# Patient Record
Sex: Female | Born: 1980 | Race: Black or African American | Hispanic: No | Marital: Single | Smoking: Never smoker
Health system: Southern US, Community
[De-identification: ages and names within clinical notes are randomized; demographics above are authoritative.]

## PROBLEM LIST (undated history)

## (undated) DIAGNOSIS — S82843A Displaced bimalleolar fracture of unspecified lower leg, initial encounter for closed fracture: Secondary | ICD-10-CM

## (undated) HISTORY — PX: NO PAST SURGERIES: SHX2092

---
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
## 2004-02-18 ENCOUNTER — Emergency Department (HOSPITAL_COMMUNITY): Admission: EM | Admit: 2004-02-18 | Discharge: 2004-02-18 | Payer: Self-pay | Admitting: Emergency Medicine

## 2004-04-26 ENCOUNTER — Emergency Department (HOSPITAL_COMMUNITY): Admission: EM | Admit: 2004-04-26 | Discharge: 2004-04-27 | Payer: Self-pay | Admitting: Emergency Medicine

## 2004-11-27 ENCOUNTER — Emergency Department (HOSPITAL_COMMUNITY): Admission: EM | Admit: 2004-11-27 | Discharge: 2004-11-27 | Payer: Self-pay | Admitting: Emergency Medicine

## 2009-06-02 ENCOUNTER — Emergency Department (HOSPITAL_COMMUNITY): Admission: EM | Admit: 2009-06-02 | Discharge: 2009-06-02 | Payer: Self-pay | Admitting: Emergency Medicine

## 2010-01-01 ENCOUNTER — Emergency Department (HOSPITAL_COMMUNITY): Admission: EM | Admit: 2010-01-01 | Discharge: 2010-01-01 | Payer: Self-pay | Admitting: Family Medicine

## 2010-01-03 ENCOUNTER — Emergency Department (HOSPITAL_COMMUNITY): Admission: EM | Admit: 2010-01-03 | Discharge: 2010-01-03 | Payer: Self-pay | Admitting: Family Medicine

## 2010-12-09 ENCOUNTER — Inpatient Hospital Stay (INDEPENDENT_AMBULATORY_CARE_PROVIDER_SITE_OTHER)
Admission: RE | Admit: 2010-12-09 | Discharge: 2010-12-09 | Disposition: A | Discharge status: Home / Self Care | Payer: Self-pay | Source: Ambulatory Visit | Attending: Family Medicine | Admitting: Family Medicine

## 2010-12-09 DIAGNOSIS — R071 Chest pain on breathing: Secondary | ICD-10-CM

## 2010-12-09 DIAGNOSIS — IMO0002 Reserved for concepts with insufficient information to code with codable children: Secondary | ICD-10-CM

## 2010-12-12 ENCOUNTER — Inpatient Hospital Stay (INDEPENDENT_AMBULATORY_CARE_PROVIDER_SITE_OTHER)
Admission: RE | Admit: 2010-12-12 | Discharge: 2010-12-12 | Disposition: A | Discharge status: Home / Self Care | Payer: Self-pay | Source: Ambulatory Visit | Attending: Family Medicine | Admitting: Family Medicine

## 2010-12-12 DIAGNOSIS — IMO0002 Reserved for concepts with insufficient information to code with codable children: Secondary | ICD-10-CM
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
## 2011-04-06 ENCOUNTER — Inpatient Hospital Stay (INDEPENDENT_AMBULATORY_CARE_PROVIDER_SITE_OTHER)
Admission: RE | Admit: 2011-04-06 | Discharge: 2011-04-06 | Disposition: A | Discharge status: Home / Self Care | Payer: Self-pay | Source: Ambulatory Visit | Attending: Family Medicine | Admitting: Family Medicine

## 2011-04-06 DIAGNOSIS — IMO0002 Reserved for concepts with insufficient information to code with codable children: Secondary | ICD-10-CM

## 2011-10-02 ENCOUNTER — Encounter (HOSPITAL_COMMUNITY): Payer: Self-pay | Admitting: *Deleted

## 2011-10-02 ENCOUNTER — Emergency Department (HOSPITAL_COMMUNITY)
Admission: EM | Admit: 2011-10-02 | Discharge: 2011-10-03 | Disposition: A | Discharge status: Home / Self Care | Payer: Self-pay | Attending: Emergency Medicine | Admitting: Emergency Medicine

## 2011-10-02 DIAGNOSIS — R609 Edema, unspecified: Secondary | ICD-10-CM | POA: Insufficient documentation

## 2011-10-02 DIAGNOSIS — L02412 Cutaneous abscess of left axilla: Secondary | ICD-10-CM

## 2011-10-02 DIAGNOSIS — M7989 Other specified soft tissue disorders: Secondary | ICD-10-CM | POA: Insufficient documentation

## 2011-10-02 DIAGNOSIS — IMO0002 Reserved for concepts with insufficient information to code with codable children: Secondary | ICD-10-CM | POA: Insufficient documentation

## 2011-10-02 DIAGNOSIS — M79609 Pain in unspecified limb: Secondary | ICD-10-CM | POA: Insufficient documentation

## 2011-10-02 MED ORDER — HYDROCODONE-ACETAMINOPHEN 5-325 MG PO TABS: 1.0000 | ORAL_TABLET | ORAL | Status: AC | PRN

## 2011-10-02 MED ORDER — SULFAMETHOXAZOLE-TRIMETHOPRIM 800-160 MG PO TABS: 1.0000 | ORAL_TABLET | Freq: Two times a day (BID) | ORAL | Status: AC

## 2011-10-02 MED ORDER — HYDROCODONE-ACETAMINOPHEN 5-325 MG PO TABS
1.0000 | ORAL_TABLET | Freq: Once | ORAL | Status: AC
  Administered 2011-10-02: 1 via ORAL
  Filled 2011-10-02: qty 1

## 2011-10-02 NOTE — ED Notes (Signed)
Pt reports having a boil under (L) arm since Thursday of last week.  Hx of boil under (R) arm.  Pt has not messed with boil.  denies fever.

## 2011-10-02 NOTE — ED Notes (Signed)
Boil under her lt arm since Friday.  She has been treated for the saem previously and antibiotics took it away

## 2011-10-02 NOTE — Discharge Instructions (Signed)

## 2011-10-03 NOTE — ED Provider Notes (Signed)
History     CSN: [id number]  Arrival date & time 10/02/11  2026   First MD Initiated Contact with Patient 10/02/11 2144      Chief Complaint  Patient presents with  . Recurrent Skin Infections    (Consider location/radiation/quality/duration/timing/severity/associated sxs/prior treatment) HPI Comments: Patient here with left axillary abscess - states history of same - reports no fever or chills - no drainage from the area.  Patient is a 31 y.o. female presenting with abscess. The history is provided by the patient. No language interpreter was used.  Abscess  This is a new problem. The current episode started less than one week ago. The onset was gradual. The problem occurs frequently. The problem has been unchanged. The abscess is present on the left arm. The problem is severe. The abscess is characterized by redness, painfulness and swelling. The patient was exposed to OTC medications. The abscess first occurred at home. Pertinent negatives include no anorexia, no decrease in physical activity, not sleeping less, not drinking less, no fever, no fussiness, not sleeping more, no diarrhea, no vomiting, no congestion, no rhinorrhea, no sore throat, no decreased responsiveness and no cough. Her past medical history does not include skin abscesses in family. There were no sick contacts. She has received no recent medical care.    History reviewed. No pertinent past medical history.  History reviewed. No pertinent past surgical history.  History reviewed. No pertinent family history.  History  Substance Use Topics  . Smoking status: Never Smoker   . Smokeless tobacco: Not on file  . Alcohol Use: Yes    OB History    Grav Para Term Preterm Abortions TAB SAB Ect Mult Living                  Review of Systems  Constitutional: Negative for fever and decreased responsiveness.  HENT: Negative for congestion, sore throat and rhinorrhea.   Respiratory: Negative for cough.     Gastrointestinal: Negative for vomiting, diarrhea and anorexia.  All other systems reviewed and are negative.    Allergies  Review of patient's allergies indicates no known allergies.  Home Medications   Current Outpatient Rx  Name Route Sig Dispense Refill  . HYDROCODONE-ACETAMINOPHEN 5-325 MG PO TABS Oral Take 1 tablet by mouth every 6 (six) hours as needed. For pain    . NYQUIL PO Oral Take 30 mLs by mouth daily as needed. For cough/cold symptoms    . HYDROCODONE-ACETAMINOPHEN 5-325 MG PO TABS Oral Take 1 tablet by mouth every 4 (four) hours as needed for pain. 30 tablet 0  . SULFAMETHOXAZOLE-TRIMETHOPRIM 800-160 MG PO TABS Oral Take 1 tablet by mouth every 12 (twelve) hours. 10 tablet 0    BP 118/83  Pulse 84  Temp(Src) 98.3 F (36.8 C) (Oral)  Resp 18  SpO2 100%  LMP 09/01/2011  Physical Exam  Nursing note and vitals reviewed. Constitutional: She is oriented to person, place, and time. She appears well-developed and well-nourished. No distress.  HENT:  Head: Normocephalic and atraumatic.  Right Ear: External ear normal.  Left Ear: External ear normal.  Nose: Nose normal.  Mouth/Throat: Oropharynx is clear and moist. No oropharyngeal exudate.  Eyes: Conjunctivae are normal. Pupils are equal, round, and reactive to light. No scleral icterus.  Neck: Normal range of motion. Neck supple.  Cardiovascular: Normal rate, regular rhythm and normal heart sounds.  Exam reveals no gallop and no friction rub.   No murmur heard. Pulmonary/Chest: Breath sounds normal. No respiratory  distress. She exhibits no tenderness.  Abdominal: Soft. Bowel sounds are normal. She exhibits no distension. There is no tenderness.  Musculoskeletal: Normal range of motion. She exhibits edema and tenderness.  Lymphadenopathy:    She has no cervical adenopathy.  Neurological: She is alert and oriented to person, place, and time. No cranial nerve deficit.  Skin: Skin is warm and dry. No rash noted.  There is erythema. No pallor.       Large fluctuance under left axilla  Psychiatric: She has a normal mood and affect. Her behavior is normal. Judgment and thought content normal.    ED Course  Procedures (including critical care time)  Labs Reviewed - No data to display No results found. INCISION AND DRAINAGE Performed by: Cherrie Distance C. Consent: Verbal consent obtained. Risks and benefits: risks, benefits and alternatives were discussed Type: abscess  Body area: left axilla  Anesthesia: local infiltration  Local anesthetic: lidocaine 2% without epinephrine  Anesthetic total: 5 ml  Complexity: complex Blunt dissection to break up loculations  Drainage: purulent  Drainage amount: large  Packing material: 1/4 in iodoform gauze  Patient tolerance: Patient tolerated the procedure well with no immediate complications.    1. Abscess of axilla, left       MDM  Patient with large left axillary abscess - placed on abx and pain control - patient to follow up in 2 days for packing removal and re-evaluation.        Izola Price [place], [place] 10/03/11 [phone]

## 2011-10-03 NOTE — ED Provider Notes (Signed)
Medical screening examination/treatment/procedure(s) were performed by non-physician practitioner and as supervising physician I was immediately available for consultation/collaboration.  Kya Mayfield, MD 10/03/11 0724 

## 2011-10-04 ENCOUNTER — Emergency Department (INDEPENDENT_AMBULATORY_CARE_PROVIDER_SITE_OTHER)
Admission: EM | Admit: 2011-10-04 | Discharge: 2011-10-04 | Disposition: A | Discharge status: Home / Self Care | Payer: Self-pay | Source: Home / Self Care | Attending: Emergency Medicine | Admitting: Emergency Medicine

## 2011-10-04 ENCOUNTER — Encounter (HOSPITAL_COMMUNITY): Payer: Self-pay | Admitting: Emergency Medicine

## 2011-10-04 DIAGNOSIS — IMO0002 Reserved for concepts with insufficient information to code with codable children: Secondary | ICD-10-CM

## 2011-10-04 DIAGNOSIS — L02412 Cutaneous abscess of left axilla: Secondary | ICD-10-CM

## 2011-10-04 NOTE — Discharge Instructions (Signed)
You may wash the area with mild soap and water now that the packing is removed. Change the dressing once a day or more often if needed, until the incision is healed. Continue your antibiotic as prescribed. If you still have redness and swelling at the end of your antibiotic return for recheck, sooner if worsening.

## 2011-10-04 NOTE — ED Provider Notes (Signed)
Medical screening examination/treatment/procedure(s) were performed by non-physician practitioner and as supervising physician I was immediately available for consultation/collaboration.  Raynald Blend, MD 10/04/11 [phone]

## 2011-10-04 NOTE — ED Notes (Signed)
Pt here for packing removal of left axilla area. Pt states her first day on antibiotics she had some nausea and vomiting, but it has improved. Pt had some serosanguinous described drainage last night but it stopped after about 20 minutes. No drainage today.

## 2011-10-04 NOTE — ED Provider Notes (Signed)
History     CSN: [id number]  Arrival date & time 10/04/11  1652   First MD Initiated Contact with Patient 10/04/11 1703      Chief Complaint  Patient presents with  . Wound Check    (Consider location/radiation/quality/duration/timing/severity/associated sxs/prior treatment) HPI Comments: Pt presents today for recheck of abscess Lt axilla. She was seen in ED 2 days ago and had I&D. She is still noting some drainage, but overall is improving. No fever or chills.    History reviewed. No pertinent past medical history.  History reviewed. No pertinent past surgical history.  No family history on file.  History  Substance Use Topics  . Smoking status: Never Smoker   . Smokeless tobacco: Not on file  . Alcohol Use: Yes    OB History    Grav Para Term Preterm Abortions TAB SAB Ect Mult Living                  Review of Systems  Constitutional: Negative for fever and chills.  Gastrointestinal: Negative for vomiting, abdominal pain and diarrhea.    Allergies  Review of patient's allergies indicates no known allergies.  Home Medications   Current Outpatient Rx  Name Route Sig Dispense Refill  . NYQUIL PO Oral Take 30 mLs by mouth daily as needed. For cough/cold symptoms    . SULFAMETHOXAZOLE-TRIMETHOPRIM 800-160 MG PO TABS Oral Take 1 tablet by mouth every 12 (twelve) hours. 10 tablet 0  . HYDROCODONE-ACETAMINOPHEN 5-325 MG PO TABS Oral Take 1 tablet by mouth every 4 (four) hours as needed for pain. 30 tablet 0    BP 114/80  Pulse 78  Temp(Src) 99.2 F (37.3 C) (Oral)  Resp 17  LMP 09/01/2011  Physical Exam  Nursing note and vitals reviewed. Constitutional: She appears well-developed and well-nourished. No distress.  HENT:  Head: Normocephalic and atraumatic.  Skin: Skin is warm and dry.       Lt axilla - abscess noted posterior inferior axilla. Packing removed. Moderate amount of yellowish pus expressed.   Psychiatric: She has a normal mood and affect.     ED Course  Procedures (including critical care time)  Labs Reviewed - No data to display No results found.   1. Abscess of axilla, left       MDM   Abscess Lt axilla - recheck and packing removal. Pt noting overall improvement.        Melody Com[place], [place] 10/04/11 1750

## 2012-04-25 ENCOUNTER — Emergency Department (HOSPITAL_COMMUNITY)
Admission: EM | Admit: 2012-04-25 | Discharge: 2012-04-25 | Disposition: A | Discharge status: Home / Self Care | Payer: Self-pay | Attending: Emergency Medicine | Admitting: Emergency Medicine

## 2012-04-25 ENCOUNTER — Emergency Department (HOSPITAL_COMMUNITY): Payer: Self-pay

## 2012-04-25 ENCOUNTER — Encounter (HOSPITAL_COMMUNITY): Payer: Self-pay | Admitting: Family Medicine

## 2012-04-25 DIAGNOSIS — B372 Candidiasis of skin and nail: Secondary | ICD-10-CM | POA: Insufficient documentation

## 2012-04-25 DIAGNOSIS — R059 Cough, unspecified: Secondary | ICD-10-CM | POA: Insufficient documentation

## 2012-04-25 DIAGNOSIS — R05 Cough: Secondary | ICD-10-CM

## 2012-04-25 MED ORDER — HYDROCODONE-HOMATROPINE 5-1.5 MG/5ML PO SYRP: 5.0000 mL | ORAL_SOLUTION | Freq: Four times a day (QID) | ORAL | Status: AC | PRN

## 2012-04-25 MED ORDER — CLOTRIMAZOLE 1 % EX CREA: TOPICAL_CREAM | CUTANEOUS | Status: AC

## 2012-04-25 NOTE — ED Notes (Signed)
Complaining of dry cough x 1 week. sts pain in chest with cough and movement. Denies fever or injury

## 2012-04-25 NOTE — ED Notes (Signed)
Lungs  Clear through out.  St's she has had dry cough for several days. Denies sore throat nausea or vomiting

## 2012-04-25 NOTE — ED Provider Notes (Signed)
History   This chart was scribed for Gerhard Munch, MD by Melba Coon. The patient was seen in room TR07C/TR07C and the patient's care was started at 7:32PM.    CSN: [id number]  Arrival date & time 04/25/12  1551   First MD Initiated Contact with Patient 04/25/12 1823      Chief Complaint  Patient presents with  . Cough    (Consider location/radiation/quality/duration/timing/severity/associated sxs/prior treatment) The history is provided by the patient. No language interpreter was used.   Gina Jones is a 31 y.o. female who presents to the Emergency Department complaining of persistent, moderate non-productive cough with an onset a week ago. Pt states that she had mold in her house a month ago. Rash under her breast and in her groin area present. Pt also c/o CP that is aggravated with change in position for the past 2 weeks. No HA, fever, neck pain, sore throat, back pain, SOB, abd pain, n/v/d, dysuria, vaginal d/c or bleeding, or extremity pain, edema, weakness, numbness, or tingling. No known allergies. No other pertinent medical symptoms. Pt is a former current smoker.  History reviewed. No pertinent past medical history.  History reviewed. No pertinent past surgical history.  History reviewed. No pertinent family history.    OB History    Grav Para Term Preterm Abortions TAB SAB Ect Mult Living                  Review of Systems  Respiratory: Positive for cough.    10 Systems reviewed and all are negative for acute change except as noted in the HPI.   Allergies  Review of patient's allergies indicates no known allergies.  Home Medications  No current outpatient prescriptions on file.  BP 158/84  Pulse 89  Temp 98.4 F (36.9 C) (Oral)  Resp 16  SpO2 98%  LMP 04/03/2012  Physical Exam  Nursing note and vitals reviewed. Constitutional: She is oriented to person, place, and time. She appears well-developed and well-nourished.  HENT:  Head:  Normocephalic and atraumatic.  Eyes: EOM are normal. Pupils are equal, round, and reactive to light.  Neck: Normal range of motion. Neck supple.  Cardiovascular: Normal rate, normal heart sounds and intact distal pulses.   Pulmonary/Chest: Effort normal and breath sounds normal.  Abdominal: Bowel sounds are normal. She exhibits no distension. There is no tenderness.  Musculoskeletal: Normal range of motion. She exhibits no edema and no tenderness.  Neurological: She is alert and oriented to person, place, and time. She has normal strength. No cranial nerve deficit or sensory deficit.  Skin: Skin is warm and dry. Rash (Inframammary rash that is minimally raised and bilaterally symmetrical) noted.  Psychiatric: She has a normal mood and affect.    ED Course  Procedures (including critical care time)  COORDINATION OF CARE:  7:37PM - imaging reviewed; results are unremarkable. pt will be Rx hycodan and topical anti-fungal cream. Pt should f/u with PCP or ED if symptoms get worse. Pt ready for d/c.   Labs Reviewed - No data to display Dg Chest 2 View  04/25/2012  *RADIOLOGY REPORT*  Clinical Data: Cough, chest pain  CHEST - 2 VIEW  Comparison: 06/02/2009  Findings: Lungs are essentially clear. No pleural effusion or pneumothorax.  Cardiomediastinal silhouette is within normal limits.  Visualized osseous structures are within normal limits.  IMPRESSION: No evidence of acute cardiopulmonary disease.   Original Report Authenticated By: Charline Bills, M.D.      No diagnosis found.  MDM  I personally performed the services described in this documentation, which was scribed in my presence. The recorded information has been reviewed and considered.  This generally healthy Gina Jones female presents with 2 complaints.  The patient's skin lesions are most consistent with cutaneous candidiasis, with low suspicion of concurrent bacterial infection.  The patient's respiratory complaints were  evaluated with a chest x-ray which did not demonstrate pneumonia.  Distress, hypoxia, tachypnea, tachycardia and suspicion for occult pneumonia or PE.  The patient was discharged in stable condition with resources for primary care followup.  Gerhard Munch, MD 04/25/12 [phone]

## 2014-04-11 ENCOUNTER — Encounter (HOSPITAL_COMMUNITY): Payer: Self-pay | Admitting: Emergency Medicine

## 2014-04-11 ENCOUNTER — Emergency Department (HOSPITAL_COMMUNITY)
Admission: EM | Admit: 2014-04-11 | Discharge: 2014-04-11 | Disposition: A | Discharge status: Home / Self Care | Payer: BC Managed Care – PPO | Attending: Emergency Medicine | Admitting: Emergency Medicine

## 2014-04-11 DIAGNOSIS — H9209 Otalgia, unspecified ear: Secondary | ICD-10-CM | POA: Insufficient documentation

## 2014-04-11 DIAGNOSIS — H6690 Otitis media, unspecified, unspecified ear: Secondary | ICD-10-CM | POA: Insufficient documentation

## 2014-04-11 DIAGNOSIS — H6692 Otitis media, unspecified, left ear: Secondary | ICD-10-CM

## 2014-04-11 MED ORDER — AMOXICILLIN-POT CLAVULANATE 875-125 MG PO TABS: 1.0000 | ORAL_TABLET | Freq: Two times a day (BID) | ORAL | Status: DC

## 2014-04-11 MED ORDER — HYDROCODONE-ACETAMINOPHEN 5-325 MG PO TABS
1.0000 | ORAL_TABLET | Freq: Once | ORAL | Status: AC
  Administered 2014-04-11: 1 via ORAL
  Filled 2014-04-11: qty 1

## 2014-04-11 MED ORDER — HYDROCODONE-ACETAMINOPHEN 5-325 MG PO TABS: 1.0000 | ORAL_TABLET | Freq: Four times a day (QID) | ORAL | Status: DC | PRN

## 2014-04-11 MED ORDER — GUAIFENESIN ER 1200 MG PO TB12: 1.0000 | ORAL_TABLET | Freq: Two times a day (BID) | ORAL | Status: DC

## 2014-04-11 NOTE — ED Notes (Signed)
She states "my left ears been hurting for 2 days."

## 2014-04-11 NOTE — ED Provider Notes (Signed)
Medical screening examination/treatment/procedure(s) were performed by non-physician practitioner and as supervising physician I was immediately available for consultation/collaboration.   EKG Interpretation None        Layla Maw Annalei Friesz, DO 04/11/14 1426

## 2014-04-11 NOTE — ED Notes (Signed)
Declined W/C at D/C and was escorted to lobby by RN. 

## 2014-04-11 NOTE — Discharge Instructions (Signed)
Return here as needed.  Followup with an urgent care primary care Dr. increase your fluid intake

## 2014-04-11 NOTE — ED Provider Notes (Signed)
CSN: [id number]     Arrival date & time 04/11/14  1233 History  This chart was scribed for non-physician practitioner Ebbie Ridge, PA-C working with Layla Maw Ward, DO by Leone Payor, ED Scribe. This patient was seen in room TR04C/TR04C and the patient's care was started at 1:37 PM.    Chief Complaint  Patient presents with  . Otalgia    The history is provided by the patient. No language interpreter was used.    HPI Comments: HIDAYA DANIEL is a 33 y.o. female who presents to the Emergency Department complaining of 2 days of gradual onset, constant, gradually worsening left sided otalgia. She has used hydrogen peroxide and taken Advil and BC powders without relief. She denies cough, rhinorrhea, decreased hearing, sore throat, nasal congestion.   History reviewed. No pertinent past medical history. History reviewed. No pertinent past surgical history. History reviewed. No pertinent family history. History  Substance Use Topics  . Smoking status: Never Smoker   . Smokeless tobacco: Not on file  . Alcohol Use: Yes   OB History   Grav Para Term Preterm Abortions TAB SAB Ect Mult Living                 Review of Systems  HENT: Positive for ear pain. Negative for congestion, hearing loss, rhinorrhea and sore throat.   Respiratory: Negative for cough.       Allergies  Review of patient's allergies indicates no known allergies.  Home Medications   Prior to Admission medications   Not on File   BP 137/92  Pulse 63  Temp(Src) 98.2 F (36.8 C) (Oral)  Resp 18  Ht  (1.727 m)  Wt 220 lb (99.791 kg)  BMI 33.46 kg/m2  SpO2 100% Physical Exam  Nursing note and vitals reviewed. Constitutional: She is oriented to person, place, and time. She appears well-developed and well-nourished.  HENT:  Head: Normocephalic and atraumatic.  Left Ear: Tympanic membrane is erythematous and bulging.  Redness and bulging of left TM.   Cardiovascular: Normal rate.   Pulmonary/Chest:  Effort normal.  Abdominal: She exhibits no distension.  Neurological: She is alert and oriented to person, place, and time.  Skin: Skin is warm and dry.  Psychiatric: She has a normal mood and affect.    ED Course  Procedures (including critical care time)  DIAGNOSTIC STUDIES: Oxygen Saturation is 100% on RA, normal by my interpretation.    COORDINATION OF CARE: 1:39 PM Discussed treatment plan with pt at bedside and pt agreed to plan.     Carlyle Dolly, PA-C 04/11/14 1357

## 2014-04-24 ENCOUNTER — Emergency Department (HOSPITAL_COMMUNITY)
Admission: EM | Admit: 2014-04-24 | Discharge: 2014-04-24 | Disposition: A | Discharge status: Home / Self Care | Payer: BC Managed Care – PPO | Attending: Emergency Medicine | Admitting: Emergency Medicine

## 2014-04-24 ENCOUNTER — Encounter (HOSPITAL_COMMUNITY): Payer: Self-pay | Admitting: Emergency Medicine

## 2014-04-24 DIAGNOSIS — S3981XA Other specified injuries of abdomen, initial encounter: Secondary | ICD-10-CM | POA: Diagnosis present

## 2014-04-24 DIAGNOSIS — Y92410 Unspecified street and highway as the place of occurrence of the external cause: Secondary | ICD-10-CM | POA: Diagnosis not present

## 2014-04-24 DIAGNOSIS — Z79899 Other long term (current) drug therapy: Secondary | ICD-10-CM | POA: Diagnosis not present

## 2014-04-24 DIAGNOSIS — Z3202 Encounter for pregnancy test, result negative: Secondary | ICD-10-CM | POA: Diagnosis not present

## 2014-04-24 DIAGNOSIS — S301XXA Contusion of abdominal wall, initial encounter: Secondary | ICD-10-CM | POA: Insufficient documentation

## 2014-04-24 DIAGNOSIS — Y9389 Activity, other specified: Secondary | ICD-10-CM | POA: Insufficient documentation

## 2014-04-24 LAB — URINALYSIS, ROUTINE W REFLEX MICROSCOPIC
BILIRUBIN URINE: NEGATIVE
Glucose, UA: NEGATIVE mg/dL
HGB URINE DIPSTICK: NEGATIVE
Ketones, ur: NEGATIVE mg/dL
Leukocytes, UA: NEGATIVE
Nitrite: NEGATIVE
PH: 6 (ref 5.0–8.0)
Protein, ur: NEGATIVE mg/dL
SPECIFIC GRAVITY, URINE: 1.008 (ref 1.005–1.030)
UROBILINOGEN UA: 0.2 mg/dL (ref 0.0–1.0)

## 2014-04-24 LAB — POC URINE PREG, ED: PREG TEST UR: NEGATIVE
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
## 2014-04-24 NOTE — Discharge Instructions (Signed)
SEEK IMMEDIATE MEDICAL ATTENTION IF: New numbness, tingling, weakness, or problem with the use of your arms or legs.  Severe pain not relieved with OTC medications.  Change in bowel or bladder control.  Increasing pain in any areas of the body (such as chest or abdominal pain).  Shortness of breath, dizziness or fainting.  Nausea (feeling sick to your stomach), vomiting, fever, or sweats.

## 2014-06-18 ENCOUNTER — Emergency Department (INDEPENDENT_AMBULATORY_CARE_PROVIDER_SITE_OTHER)
Admission: EM | Admit: 2014-06-18 | Discharge: 2014-06-18 | Disposition: A | Discharge status: Home / Self Care | Payer: BC Managed Care – PPO | Source: Home / Self Care

## 2014-06-18 ENCOUNTER — Encounter (HOSPITAL_COMMUNITY): Payer: Self-pay | Admitting: Emergency Medicine

## 2014-06-18 DIAGNOSIS — M940 Chondrocostal junction syndrome [Tietze]: Secondary | ICD-10-CM

## 2014-06-18 DIAGNOSIS — K2900 Acute gastritis without bleeding: Secondary | ICD-10-CM

## 2014-06-18 MED ORDER — GI COCKTAIL ~~LOC~~
ORAL | Status: AC
  Filled 2014-06-18: qty 30

## 2014-06-18 MED ORDER — GI COCKTAIL ~~LOC~~
30.0000 mL | Freq: Once | ORAL | Status: DC
Start: 2014-06-18 — End: 2014-06-18

## 2014-06-18 MED ORDER — PANTOPRAZOLE SODIUM 40 MG PO TBEC: 40.0000 mg | DELAYED_RELEASE_TABLET | Freq: Every day | ORAL | Status: DC

## 2014-06-18 NOTE — ED Provider Notes (Signed)
CSN: [id number]     Arrival date & time 06/18/14  1545 History   First MD Initiated Contact with Patient 06/18/14 1635     Chief Complaint  Patient presents with  . Abdominal Pain   (Consider location/radiation/quality/duration/timing/severity/associated sxs/prior Treatment) HPI Comments: 33 year old female complaining of epigastric pain for 5 days. This began after she initiated Alka-Seltzer cold plus medication for URI symptoms. She describes the pain more as uncomfortable discomfort and like hunger pains all the time.. Denies cramping. Nothing makes it worse and Pepto-Bismol made it better however shortly after taking the Pepto-Bismol she vomited. That was the only occurrence of vomiting. Denies hematemesis.     History reviewed. No pertinent past medical history. History reviewed. No pertinent past surgical history. No family history on file. History  Substance Use Topics  . Smoking status: Never Smoker   . Smokeless tobacco: Not on file  . Alcohol Use: Yes   OB History   Grav Para Term Preterm Abortions TAB SAB Ect Mult Living                 Review of Systems  Constitutional: Positive for appetite change. Negative for fever, activity change and fatigue.  Respiratory: Negative.  Negative for shortness of breath.   Cardiovascular: Negative.   Gastrointestinal: Positive for nausea and abdominal pain. Negative for blood in stool.  Genitourinary: Negative.   Skin: Negative.  Negative for rash.  Neurological: Negative.     Allergies  Review of patient's allergies indicates no known allergies.  Home Medications   Prior to Admission medications   Medication Sig Start Date End Date Taking? Authorizing Provider  Guaifenesin 1200 MG TB12 Take 1 tablet (1,200 mg total) by mouth 2 (two) times daily. 04/11/14   Jamesetta Orleanshristopher W Lawyer, PA-C  pantoprazole (PROTONIX) 40 MG tablet Take 1 tablet (40 mg total) by mouth daily. 06/18/14   Hayden Rasmussenavid Kathan Kirker, NP   BP 138/96  Pulse 70  Temp(Src)  98.6 F (37 C) (Oral)  Resp 16  SpO2 100%  LMP 06/03/2014 Physical Exam  Nursing note and vitals reviewed. Constitutional: She is oriented to person, place, and time. She appears well-developed and well-nourished. No distress.  HENT:  Mouth/Throat: Oropharynx is clear and moist. No oropharyngeal exudate.  Eyes: Conjunctivae are normal.  Neck: Normal range of motion. Neck supple.  Cardiovascular: Normal rate.   Pulmonary/Chest: Effort normal and breath sounds normal.  While palpating the abdomen the patient pointed to the xiphoid process as another source of pain. Palpation of the xiphoid process and the medial costal margin produces moderate pain.  Abdominal: Soft. Bowel sounds are normal. She exhibits no distension and no mass. There is tenderness. There is no rebound and no guarding.  Mild epigastric tenderness.  Musculoskeletal: She exhibits no edema.  Lymphadenopathy:    She has no cervical adenopathy.  Neurological: She is alert and oriented to person, place, and time. She exhibits normal muscle tone.  Skin: Skin is warm and dry. No rash noted.    ED Course  Procedures (including critical care time) Labs Review Labs Reviewed - No data to display  Imaging Review No results found.   MDM   1. Acute gastritis without hemorrhage   2. Costochondritis, acute     GI cocktail 30 cc po now protonix q d Avoid cold meds and NSAIDS Tylenol for pain and ice packs to chest. Bland foods.    Hayden Rasmussenavid Fleurette Woolbright, NP 06/18/14 1702  Hayden Rasmussenavid Kealey Kemmer, NP 06/18/14 [phone]

## 2014-06-18 NOTE — ED Notes (Signed)
Patient c/o epigastric pain x 11 days. Patient reports she had cold sx over the last weekend and took [employer] seltzer and sx began after that. Patient reports she took pepto bismo last night and vomited and felt better. Patient states pain is not a sharp pain but a pressure. NAD.

## 2014-06-18 NOTE — Discharge Instructions (Signed)
Costochondritis Tylenol. No ibuprofen or aleve or aspirin due to stomach upset Ice locally Costochondritis, sometimes called Tietze syndrome, is a swelling and irritation (inflammation) of the tissue (cartilage) that connects your ribs with your breastbone (sternum). It causes pain in the chest and rib area. Costochondritis usually goes away on its own over time. It can take up to 6 weeks or longer to get better, especially if you are unable to limit your activities. CAUSES  Some cases of costochondritis have no known cause. Possible causes include:  Injury (trauma).  Exercise or activity such as lifting.  Severe coughing. SIGNS AND SYMPTOMS  Pain and tenderness in the chest and rib area.  Pain that gets worse when coughing or taking deep breaths.  Pain that gets worse with specific movements. DIAGNOSIS  Your health care provider will do a physical exam and ask about your symptoms. Chest X-rays or other tests may be done to rule out other problems. TREATMENT  Costochondritis usually goes away on its own over time. Your health care provider may prescribe medicine to help relieve pain. HOME CARE INSTRUCTIONS   Avoid exhausting physical activity. Try not to strain your ribs during normal activity. This would include any activities using chest, abdominal, and side muscles, especially if heavy weights are used.  Apply ice to the affected area for the first 2 days after the pain begins.  Put ice in a plastic bag.  Place a towel between your skin and the bag.  Leave the ice on for 20 minutes, 2-3 times a day.  Only take over-the-counter or prescription medicines as directed by your health care provider. SEEK MEDICAL CARE IF:  You have redness or swelling at the rib joints. These are signs of infection.  Your pain does not go away despite rest or medicine. SEEK IMMEDIATE MEDICAL CARE IF:   Your pain increases or you are very uncomfortable.  You have shortness of breath or  difficulty breathing.  You cough up blood.  You have worse chest pains, sweating, or vomiting.  You have a fever or persistent symptoms for more than 2-3 days.  You have a fever and your symptoms suddenly get worse. MAKE SURE YOU:   Understand these instructions.  Will watch your condition.  Will get help right away if you are not doing well or get worse. Document Released: 05/16/2005 Document Revised: 05/27/2013 Document Reviewed: 03/10/2013 [facility] Patient Information 2015 [place], [place]. This information is not intended to replace advice given to you by your health care provider. Make sure you discuss any questions you have with your health care provider.  Gastritis, Adult Take Protonix once daily. Gastritis is soreness and swelling (inflammation) of the lining of the stomach. Gastritis can develop as a sudden onset (acute) or long-term (chronic) condition. If gastritis is not treated, it can lead to stomach bleeding and ulcers. CAUSES  Gastritis occurs when the stomach lining is weak or damaged. Digestive juices from the stomach then inflame the weakened stomach lining. The stomach lining may be weak or damaged due to viral or bacterial infections. One common bacterial infection is the Helicobacter pylori infection. Gastritis can also result from excessive alcohol consumption, taking certain medicines, or having too much acid in the stomach.  SYMPTOMS  In some cases, there are no symptoms. When symptoms are present, they may include:  Pain or a burning sensation in the upper abdomen.  Nausea.  Vomiting.  An uncomfortable feeling of fullness after eating. DIAGNOSIS  Your caregiver may suspect you have  gastritis based on your symptoms and a physical exam. To determine the cause of your gastritis, your caregiver may perform the following:  Blood or stool tests to check for the H pylori bacterium.  Gastroscopy. A thin, flexible tube (endoscope) is passed down the esophagus  and into the stomach. The endoscope has a light and camera on the end. Your caregiver uses the endoscope to view the inside of the stomach.  Taking a tissue sample (biopsy) from the stomach to examine under a microscope. TREATMENT  Depending on the cause of your gastritis, medicines may be prescribed. If you have a bacterial infection, such as an H pylori infection, antibiotics may be given. If your gastritis is caused by too much acid in the stomach, H2 blockers or antacids may be given. Your caregiver may recommend that you stop taking aspirin, ibuprofen, or other nonsteroidal anti-inflammatory drugs (NSAIDs). HOME CARE INSTRUCTIONS  Only take over-the-counter or prescription medicines as directed by your caregiver.  If you were given antibiotic medicines, take them as directed. Finish them even if you start to feel better.  Drink enough fluids to keep your urine clear or pale yellow.  Avoid foods and drinks that make your symptoms worse, such as:  Caffeine or alcoholic drinks.  Chocolate.  Peppermint or mint flavorings.  Garlic and onions.  Spicy foods.  Citrus fruits, such as oranges, lemons, or limes.  Tomato-based foods such as sauce, chili, salsa, and pizza.  Fried and fatty foods.  Eat small, frequent meals instead of large meals. SEEK IMMEDIATE MEDICAL CARE IF:   You have black or dark red stools.  You vomit blood or material that looks like coffee grounds.  You are unable to keep fluids down.  Your abdominal pain gets worse.  You have a fever.  You do not feel better after 1 week.  You have any other questions or concerns. MAKE SURE YOU:  Understand these instructions.  Will watch your condition.  Will get help right away if you are not doing well or get worse. Document Released: 07/31/2001 Document Revised: 02/05/2012 Document Reviewed: 09/19/2011 [facility] ClinicExitCare Patient Information 2015 [place], [place]. This information is not intended to replace advice  given to you by your health care provider. Make sure you discuss any questions you have with your health care provider.

## 2014-06-19 NOTE — ED Provider Notes (Signed)
Medical screening examination/treatment/procedure(s) were performed by resident physician or non-physician practitioner and as supervising physician I was immediately available for consultation/collaboration.   Terease Marcotte DOUGLAS MD.   Adrean Heitz D Rontrell Moquin, MD 06/19/14 1343 

## 2014-10-09 ENCOUNTER — Emergency Department (HOSPITAL_COMMUNITY): Payer: BLUE CROSS/BLUE SHIELD

## 2014-10-09 ENCOUNTER — Emergency Department (HOSPITAL_COMMUNITY)
Admission: EM | Admit: 2014-10-09 | Discharge: 2014-10-10 | Disposition: A | Discharge status: Home / Self Care | Payer: BLUE CROSS/BLUE SHIELD | Attending: Emergency Medicine | Admitting: Emergency Medicine

## 2014-10-09 ENCOUNTER — Encounter (HOSPITAL_COMMUNITY): Payer: Self-pay | Admitting: Emergency Medicine

## 2014-10-09 DIAGNOSIS — S82841A Displaced bimalleolar fracture of right lower leg, initial encounter for closed fracture: Secondary | ICD-10-CM | POA: Diagnosis not present

## 2014-10-09 DIAGNOSIS — S82843A Displaced bimalleolar fracture of unspecified lower leg, initial encounter for closed fracture: Secondary | ICD-10-CM

## 2014-10-09 DIAGNOSIS — Z79899 Other long term (current) drug therapy: Secondary | ICD-10-CM | POA: Insufficient documentation

## 2014-10-09 DIAGNOSIS — Y9389 Activity, other specified: Secondary | ICD-10-CM | POA: Diagnosis not present

## 2014-10-09 DIAGNOSIS — Z7982 Long term (current) use of aspirin: Secondary | ICD-10-CM | POA: Diagnosis not present

## 2014-10-09 DIAGNOSIS — Y9289 Other specified places as the place of occurrence of the external cause: Secondary | ICD-10-CM | POA: Insufficient documentation

## 2014-10-09 DIAGNOSIS — X58XXXA Exposure to other specified factors, initial encounter: Secondary | ICD-10-CM | POA: Insufficient documentation

## 2014-10-09 DIAGNOSIS — S99921A Unspecified injury of right foot, initial encounter: Secondary | ICD-10-CM | POA: Diagnosis present

## 2014-10-09 DIAGNOSIS — Y998 Other external cause status: Secondary | ICD-10-CM | POA: Insufficient documentation

## 2014-10-09 DIAGNOSIS — S9301XA Subluxation of right ankle joint, initial encounter: Secondary | ICD-10-CM

## 2014-10-09 HISTORY — DX: Displaced bimalleolar fracture of unspecified lower leg, initial encounter for closed fracture: S82.843A

## 2014-10-09 NOTE — ED Provider Notes (Signed)
CSN: [id number]     Arrival date & time 10/09/14  2315 History  This chart was scribed for Gina Beck, PA-C, working with [name], MD by Elon Spanner, ED Scribe. This patient was seen in room WTR9/WTR9 and the patient's care was started at 12:02 AM.   Chief Complaint  Patient presents with  . Foot Pain   The history is provided by the patient. No language interpreter was used.   HPI Comments: Gina Jones is a 34 y.o. female who presents to the Emergency Department complaining of an inversion right ankle injury with associated pain and swelling sustained earlier today when she was "playing".  She reports hearing a snap at the time of the injury.  She has not taken anything for the pain but the pain is mildly relieved by positioning.  Patient last ate 45 minutes ago.    History reviewed. No pertinent past medical history. History reviewed. No pertinent past surgical history. History reviewed. No pertinent family history. History  Substance Use Topics  . Smoking status: Never Smoker   . Smokeless tobacco: Not on file  . Alcohol Use: Yes   OB History    No data available     Review of Systems  Musculoskeletal: Positive for joint swelling and arthralgias.  All other systems reviewed and are negative.     Allergies  Review of patient's allergies indicates no known allergies.  Home Medications   Prior to Admission medications   Medication Sig Start Date End Date Taking? Authorizing Provider  Aspirin-Salicylamide-Caffeine (BC HEADACHE POWDER PO) Take 1 packet by mouth every 6 (six) hours as needed (for pain.).   Yes Historical Provider, MD  Guaifenesin 1200 MG TB12 Take 1 tablet (1,200 mg total) by mouth 2 (two) times daily. Patient not taking: Reported on 10/09/2014 04/11/14   [name] Lawyer, PA-C  pantoprazole (PROTONIX) 40 MG tablet Take 1 tablet (40 mg total) by mouth daily. Patient not taking: Reported on 10/09/2014 06/18/14   [name], NP   BP 119/85  mmHg  Pulse 78  Temp(Src) 98.8 F (37.1 C) (Oral)  Resp 18  SpO2 100%  LMP 09/14/2014 Physical Exam  Constitutional: She is oriented to person, place, and time. She appears well-developed and well-nourished. No distress.  HENT:  Head: Normocephalic and atraumatic.  Eyes: Conjunctivae and EOM are normal.  Neck: Neck supple. No tracheal deviation present.  Cardiovascular: Normal rate and intact distal pulses.   Pulmonary/Chest: Effort normal. No respiratory distress.  Abdominal: Soft. She exhibits no distension. There is no tenderness.  Musculoskeletal:  Limited ROM of right ankle due to pain. Generalized swelling of right ankle. Medial and lateral malleolar tenderness to palpation. No obvious deformity. Patient is able to wiggle toes.   Neurological: She is alert and oriented to person, place, and time. Coordination normal.  Skin: Skin is warm and dry.  Psychiatric: She has a normal mood and affect. Her behavior is normal.  Nursing note and vitals reviewed.   ED Course  Procedures (including critical care time)  DIAGNOSTIC STUDIES: Oxygen Saturation is 100% on RA, normal by my interpretation.    COORDINATION OF CARE:  12:13 AM Discussed treatment plan with patient at bedside.  Patient acknowledges and agrees with plan.    Labs Review Labs Reviewed - No data to display  Imaging Review Dg Ankle Complete Right  10/10/2014   CLINICAL DATA:  Pt was playing with her nephew this evening and fell onto her right ankle/foot and heard a snap;  swelling and pain in entire right ankle joint, pain extends into proximal foot area  EXAM: RIGHT ANKLE - COMPLETE 3+ VIEW  COMPARISON:  None.  FINDINGS: There is a transverse fracture across the base of the medial malleolus. The medial malleolus is displaced laterally by 8 mm. The talus is subluxed laterally by the same amount.  There is an oblique fracture of the distal fibula centered on the meta diaphysis.  There is diffuse surrounding soft tissue  swelling.  IMPRESSION: Bimalleolar fracture with lateral subluxation of the talus and lateral displacement of the fibular and medial malleolar fracture fragments, displacing with the subluxed talus.   Electronically Signed   By: Amie Portlandavid  Ormond M.D.   On: 10/10/2014 00:02   Dg Foot Complete Right  10/10/2014   CLINICAL DATA:  Pt was playing with her nephew this evening and fell onto her right ankle/foot and heard a snap; swelling and pain in entire right ankle joint, pain extends into proximal foot area  EXAM: RIGHT FOOT COMPLETE - 3+ VIEW  COMPARISON:  None.  FINDINGS: There are ankle fractures as described under the ankle radiographs.  No foot fractures. Foot joints are normally spaced and aligned. Soft tissues are unremarkable.  IMPRESSION: Bimalleolar right ankle fractures detailed under the right ankle radiographs. No foot fracture or dislocation.   Electronically Signed   By: Amie Portlandavid  Ormond M.D.   On: 10/10/2014 00:03   SPLINT APPLICATION Date/Time: 1:18 AM Authorized by: Gina BeckKaitlyn Ellena Kamen Consent: Verbal consent obtained. Risks and benefits: risks, benefits and alternatives were discussed Consent given by: patient Splint applied by: orthopedic technician Location details: right ankle Splint type: posterior splint with stirrup Supplies used: orthoglass Post-procedure: The splinted body part was neurovascularly unchanged following the procedure. Patient tolerance: Patient tolerated the procedure well with no immediate complications.      EKG Interpretation None      MDM   Final diagnoses:  Bimalleolar ankle fracture, right, closed, initial encounter  Subluxation of ankle joint, right, initial encounter    1:24 AM Patient's xray shows bimalleolar fracture with subluxation of talus. I spoke with Dr. Charlann Boxerlin who advises patient to have a posterior ankle splint with stirrup and crutches. No neurovascular compromise. Patient will have percocet for pain. Vitals stable and patient afebrile.  No other injury.   I personally performed the services described in this documentation, which was scribed in my presence. The recorded information has been reviewed and is accurate.    Gina BeckKaitlyn Sarena Jezek, PA-C 10/10/14 0128  Loren Raceravid Yelverton, MD 10/10/14 [phone]

## 2014-10-09 NOTE — ED Notes (Signed)
Pt arrived t the ED with a complaint of a right foot injury.  Pt states she was playing and turned on her foot and heard it snap.  Foot is swollen and small laceration on the iside of her ankle.

## 2014-10-10 MED ORDER — OXYCODONE-ACETAMINOPHEN 5-325 MG PO TABS: 2.0000 | ORAL_TABLET | ORAL | Status: DC | PRN

## 2014-10-10 MED ORDER — OXYCODONE-ACETAMINOPHEN 5-325 MG PO TABS
2.0000 | ORAL_TABLET | Freq: Once | ORAL | Status: AC
  Administered 2014-10-10: 2 via ORAL
  Filled 2014-10-10: qty 2

## 2014-10-10 NOTE — Discharge Instructions (Signed)
Keep ankle splint intact until follow up with Dr. Charlann Boxerlin. Call the office on Monday for follow up appointment. Refer to attached documents for more information. Take Percocet as needed for pain.

## 2014-10-11 ENCOUNTER — Telehealth (HOSPITAL_BASED_OUTPATIENT_CLINIC_OR_DEPARTMENT_OTHER): Payer: Self-pay | Admitting: Emergency Medicine

## 2014-10-12 ENCOUNTER — Encounter (HOSPITAL_BASED_OUTPATIENT_CLINIC_OR_DEPARTMENT_OTHER): Payer: Self-pay | Admitting: *Deleted

## 2014-10-14 ENCOUNTER — Encounter (HOSPITAL_BASED_OUTPATIENT_CLINIC_OR_DEPARTMENT_OTHER): Payer: Self-pay | Admitting: *Deleted

## 2014-10-14 ENCOUNTER — Ambulatory Visit (HOSPITAL_BASED_OUTPATIENT_CLINIC_OR_DEPARTMENT_OTHER): Payer: BLUE CROSS/BLUE SHIELD | Admitting: Anesthesiology

## 2014-10-14 ENCOUNTER — Encounter (HOSPITAL_BASED_OUTPATIENT_CLINIC_OR_DEPARTMENT_OTHER)
Admission: RE | Disposition: A | Discharge status: Home / Self Care | Payer: Self-pay | Source: Ambulatory Visit | Attending: Orthopedic Surgery

## 2014-10-14 ENCOUNTER — Ambulatory Visit (HOSPITAL_BASED_OUTPATIENT_CLINIC_OR_DEPARTMENT_OTHER)
Admission: RE | Admit: 2014-10-14 | Discharge: 2014-10-14 | Disposition: A | Discharge status: Home / Self Care | Payer: BLUE CROSS/BLUE SHIELD | Source: Ambulatory Visit | Attending: Orthopedic Surgery | Admitting: Orthopedic Surgery

## 2014-10-14 DIAGNOSIS — S82841A Displaced bimalleolar fracture of right lower leg, initial encounter for closed fracture: Secondary | ICD-10-CM | POA: Insufficient documentation

## 2014-10-14 DIAGNOSIS — W19XXXA Unspecified fall, initial encounter: Secondary | ICD-10-CM | POA: Diagnosis not present

## 2014-10-14 DIAGNOSIS — S93491A Sprain of other ligament of right ankle, initial encounter: Secondary | ICD-10-CM | POA: Diagnosis not present

## 2014-10-14 HISTORY — DX: Displaced bimalleolar fracture of unspecified lower leg, initial encounter for closed fracture: S82.843A

## 2014-10-14 HISTORY — PX: ORIF ANKLE FRACTURE: SHX5408

## 2014-10-14 SURGERY — OPEN REDUCTION INTERNAL FIXATION (ORIF) ANKLE FRACTURE
Anesthesia: Regional | Site: Ankle | Laterality: Right

## 2014-10-14 MED ORDER — BACITRACIN ZINC 500 UNIT/GM EX OINT
TOPICAL_OINTMENT | CUTANEOUS | Status: AC
  Filled 2014-10-14: qty 28.35

## 2014-10-14 MED ORDER — OXYCODONE-ACETAMINOPHEN 5-325 MG PO TABS: 1.0000 | ORAL_TABLET | ORAL | Status: DC | PRN

## 2014-10-14 MED ORDER — DEXAMETHASONE SODIUM PHOSPHATE 10 MG/ML IJ SOLN
INTRAMUSCULAR | Status: DC | PRN
  Administered 2014-10-14: 10 mg via INTRAVENOUS

## 2014-10-14 MED ORDER — CEFAZOLIN SODIUM-DEXTROSE 2-3 GM-% IV SOLR
2.0000 g | INTRAVENOUS | Status: AC
  Administered 2014-10-14: 2 g via INTRAVENOUS

## 2014-10-14 MED ORDER — BUPIVACAINE-EPINEPHRINE 0.5% -1:200000 IJ SOLN
INTRAMUSCULAR | Status: DC | PRN
  Administered 2014-10-14: 20 mL

## 2014-10-14 MED ORDER — CHLORHEXIDINE GLUCONATE 4 % EX LIQD: 60.0000 mL | Freq: Once | CUTANEOUS | Status: DC

## 2014-10-14 MED ORDER — FENTANYL CITRATE 0.05 MG/ML IJ SOLN
INTRAMUSCULAR | Status: AC
  Filled 2014-10-14: qty 4

## 2014-10-14 MED ORDER — MIDAZOLAM HCL 2 MG/2ML IJ SOLN
INTRAMUSCULAR | Status: AC
  Filled 2014-10-14: qty 2

## 2014-10-14 MED ORDER — HYDROMORPHONE HCL 1 MG/ML IJ SOLN
0.2500 mg | INTRAMUSCULAR | Status: DC | PRN
  Administered 2014-10-14: 0.5 mg via INTRAVENOUS

## 2014-10-14 MED ORDER — PROPOFOL 10 MG/ML IV BOLUS
INTRAVENOUS | Status: DC | PRN
  Administered 2014-10-14: 100 mg via INTRAVENOUS
  Administered 2014-10-14: 250 mg via INTRAVENOUS

## 2014-10-14 MED ORDER — CEFAZOLIN SODIUM-DEXTROSE 2-3 GM-% IV SOLR
INTRAVENOUS | Status: AC
  Filled 2014-10-14: qty 50

## 2014-10-14 MED ORDER — LACTATED RINGERS IV SOLN
INTRAVENOUS | Status: DC
  Administered 2014-10-14 (×2): via INTRAVENOUS

## 2014-10-14 MED ORDER — MIDAZOLAM HCL 2 MG/2ML IJ SOLN
1.0000 mg | INTRAMUSCULAR | Status: DC | PRN
  Administered 2014-10-14 (×2): 1 mg via INTRAVENOUS
  Administered 2014-10-14: 2 mg via INTRAVENOUS
  Administered 2014-10-14: 1 mg via INTRAVENOUS

## 2014-10-14 MED ORDER — BUPIVACAINE-EPINEPHRINE (PF) 0.5% -1:200000 IJ SOLN
INTRAMUSCULAR | Status: DC | PRN
  Administered 2014-10-14 (×2): 20 mL via PERINEURAL

## 2014-10-14 MED ORDER — SODIUM CHLORIDE 0.9 % IV SOLN: INTRAVENOUS | Status: DC

## 2014-10-14 MED ORDER — OXYCODONE HCL 5 MG PO TABS: 5.0000 mg | ORAL_TABLET | Freq: Once | ORAL | Status: DC | PRN

## 2014-10-14 MED ORDER — 0.9 % SODIUM CHLORIDE (POUR BTL) OPTIME
TOPICAL | Status: DC | PRN
  Administered 2014-10-14: 400 mL

## 2014-10-14 MED ORDER — ASPIRIN EC 325 MG PO TBEC: 325.0000 mg | DELAYED_RELEASE_TABLET | Freq: Every day | ORAL | Status: AC

## 2014-10-14 MED ORDER — ONDANSETRON HCL 4 MG/2ML IJ SOLN: 4.0000 mg | Freq: Four times a day (QID) | INTRAMUSCULAR | Status: DC | PRN

## 2014-10-14 MED ORDER — FENTANYL CITRATE 0.05 MG/ML IJ SOLN
INTRAMUSCULAR | Status: AC
  Filled 2014-10-14: qty 2

## 2014-10-14 MED ORDER — ONDANSETRON HCL 4 MG/2ML IJ SOLN
INTRAMUSCULAR | Status: DC | PRN
  Administered 2014-10-14: 4 mg via INTRAVENOUS

## 2014-10-14 MED ORDER — HYDROMORPHONE HCL 1 MG/ML IJ SOLN
INTRAMUSCULAR | Status: AC
  Filled 2014-10-14: qty 1

## 2014-10-14 MED ORDER — FENTANYL CITRATE 0.05 MG/ML IJ SOLN
INTRAMUSCULAR | Status: DC | PRN
  Administered 2014-10-14 (×2): 50 ug via INTRAVENOUS

## 2014-10-14 MED ORDER — BUPIVACAINE-EPINEPHRINE (PF) 0.5% -1:200000 IJ SOLN
INTRAMUSCULAR | Status: AC
  Filled 2014-10-14: qty 30

## 2014-10-14 MED ORDER — LIDOCAINE HCL (CARDIAC) 20 MG/ML IV SOLN
INTRAVENOUS | Status: DC | PRN
  Administered 2014-10-14: 50 mg via INTRAVENOUS

## 2014-10-14 MED ORDER — BACITRACIN ZINC 500 UNIT/GM EX OINT
TOPICAL_OINTMENT | CUTANEOUS | Status: DC | PRN
  Administered 2014-10-14: 1 via TOPICAL

## 2014-10-14 MED ORDER — MIDAZOLAM HCL 2 MG/ML PO SYRP: 12.0000 mg | ORAL_SOLUTION | Freq: Once | ORAL | Status: DC | PRN

## 2014-10-14 MED ORDER — OXYCODONE HCL 5 MG/5ML PO SOLN: 5.0000 mg | Freq: Once | ORAL | Status: DC | PRN

## 2014-10-14 MED ORDER — FENTANYL CITRATE 0.05 MG/ML IJ SOLN
50.0000 ug | INTRAMUSCULAR | Status: DC | PRN
  Administered 2014-10-14 (×3): 50 ug via INTRAVENOUS
  Administered 2014-10-14: 100 ug via INTRAVENOUS

## 2014-10-14 SURGICAL SUPPLY — 84 items
BANDAGE ESMARK 6X9 LF (GAUZE/BANDAGES/DRESSINGS) ×1 IMPLANT
BIT DRILL 2.5X2.75 QC CALB (BIT) ×2 IMPLANT
BIT DRILL 2.9 CANN QC NONSTRL (BIT) ×2 IMPLANT
BIT DRILL 3.5X5.5 QC CALB (BIT) ×2 IMPLANT
BLADE SURG 15 STRL LF DISP TIS (BLADE) ×2 IMPLANT
BLADE SURG 15 STRL SS (BLADE) ×6
BNDG CMPR 9X4 STRL LF SNTH (GAUZE/BANDAGES/DRESSINGS)
BNDG CMPR 9X6 STRL LF SNTH (GAUZE/BANDAGES/DRESSINGS) ×1
BNDG COHESIVE 4X5 TAN STRL (GAUZE/BANDAGES/DRESSINGS) ×3 IMPLANT
BNDG COHESIVE 6X5 TAN STRL LF (GAUZE/BANDAGES/DRESSINGS) ×3 IMPLANT
BNDG ESMARK 4X9 LF (GAUZE/BANDAGES/DRESSINGS) IMPLANT
BNDG ESMARK 6X9 LF (GAUZE/BANDAGES/DRESSINGS) ×3
CANISTER SUCT 1200ML W/VALVE (MISCELLANEOUS) ×3 IMPLANT
CHLORAPREP W/TINT 26ML (MISCELLANEOUS) ×3 IMPLANT
COVER BACK TABLE 60X90IN (DRAPES) ×3 IMPLANT
CUFF TOURNIQUET SINGLE 34IN LL (TOURNIQUET CUFF) ×3 IMPLANT
CUFF TOURNIQUET SINGLE 44IN (TOURNIQUET CUFF) ×3 IMPLANT
DECANTER SPIKE VIAL GLASS SM (MISCELLANEOUS) IMPLANT
DRAPE C-ARM 42X72 X-RAY (DRAPES) IMPLANT
DRAPE C-ARMOR (DRAPES) IMPLANT
DRAPE EXTREMITY T 121X128X90 (DRAPE) ×3 IMPLANT
DRAPE OEC MINIVIEW 54X84 (DRAPES) ×2 IMPLANT
DRAPE U-SHAPE 47X51 STRL (DRAPES) ×3 IMPLANT
DRSG ADAPTIC 3X8 NADH LF (GAUZE/BANDAGES/DRESSINGS) ×2 IMPLANT
DRSG EMULSION OIL 3X3 NADH (GAUZE/BANDAGES/DRESSINGS) ×3 IMPLANT
DRSG PAD ABDOMINAL 8X10 ST (GAUZE/BANDAGES/DRESSINGS) ×6 IMPLANT
ELECT REM PT RETURN 9FT ADLT (ELECTROSURGICAL) ×3
ELECTRODE REM PT RTRN 9FT ADLT (ELECTROSURGICAL) ×1 IMPLANT
GAUZE SPONGE 4X4 12PLY STRL (GAUZE/BANDAGES/DRESSINGS) ×3 IMPLANT
GLOVE BIO SURGEON STRL SZ8 (GLOVE) ×3 IMPLANT
GLOVE BIOGEL M STRL SZ7.5 (GLOVE) ×2 IMPLANT
GLOVE BIOGEL PI IND STRL 7.0 (GLOVE) IMPLANT
GLOVE BIOGEL PI IND STRL 8 (GLOVE) ×1 IMPLANT
GLOVE BIOGEL PI INDICATOR 7.0 (GLOVE) ×2
GLOVE BIOGEL PI INDICATOR 8 (GLOVE) ×4
GLOVE ECLIPSE 6.5 STRL STRAW (GLOVE) ×2 IMPLANT
GLOVE EXAM NITRILE MD LF STRL (GLOVE) ×2 IMPLANT
GOWN STRL REUS W/ TWL LRG LVL3 (GOWN DISPOSABLE) ×1 IMPLANT
GOWN STRL REUS W/ TWL XL LVL3 (GOWN DISPOSABLE) ×1 IMPLANT
GOWN STRL REUS W/TWL LRG LVL3 (GOWN DISPOSABLE) ×3
GOWN STRL REUS W/TWL XL LVL3 (GOWN DISPOSABLE) ×6
K-WIRE ACE 1.6X6 (WIRE) ×6
KWIRE ACE 1.6X6 (WIRE) ×2 IMPLANT
NEEDLE HYPO 22GX1.5 SAFETY (NEEDLE) ×3 IMPLANT
NS IRRIG 1000ML POUR BTL (IV SOLUTION) ×3 IMPLANT
PACK BASIN DAY SURGERY FS (CUSTOM PROCEDURE TRAY) ×3 IMPLANT
PAD CAST 4YDX4 CTTN HI CHSV (CAST SUPPLIES) ×1 IMPLANT
PADDING CAST ABS 4INX4YD NS (CAST SUPPLIES)
PADDING CAST ABS COTTON 4X4 ST (CAST SUPPLIES) IMPLANT
PADDING CAST COTTON 4X4 STRL (CAST SUPPLIES) ×3
PADDING CAST COTTON 6X4 STRL (CAST SUPPLIES) ×3 IMPLANT
PENCIL BUTTON HOLSTER BLD 10FT (ELECTRODE) ×3 IMPLANT
PLATE ACE 100DEG 8HOLE (Plate) ×2 IMPLANT
SANITIZER HAND PURELL 535ML FO (MISCELLANEOUS) ×3 IMPLANT
SCREW ACE CAN 4.0 40M (Screw) ×2 IMPLANT
SCREW CORTICAL 3.5MM  16MM (Screw) ×6 IMPLANT
SCREW CORTICAL 3.5MM  55MM (Screw) ×2 IMPLANT
SCREW CORTICAL 3.5MM 14MM (Screw) ×6 IMPLANT
SCREW CORTICAL 3.5MM 16MM (Screw) ×3 IMPLANT
SCREW CORTICAL 3.5MM 24MM (Screw) ×2 IMPLANT
SCREW CORTICAL 3.5MM 50MM (Screw) ×2 IMPLANT
SCREW CORTICAL 3.5MM 55MM (Screw) ×1 IMPLANT
SHEET MEDIUM DRAPE 40X70 STRL (DRAPES) ×3 IMPLANT
SLEEVE SCD COMPRESS KNEE MED (MISCELLANEOUS) ×3 IMPLANT
SPLINT FAST PLASTER 5X30 (CAST SUPPLIES) ×40
SPLINT PLASTER CAST FAST 5X30 (CAST SUPPLIES) ×20 IMPLANT
SPONGE LAP 18X18 X RAY DECT (DISPOSABLE) ×3 IMPLANT
STOCKINETTE 6  STRL (DRAPES) ×2
STOCKINETTE 6 STRL (DRAPES) ×1 IMPLANT
SUCTION FRAZIER TIP 10 FR DISP (SUCTIONS) ×3 IMPLANT
SUT ETHILON 3 0 PS 1 (SUTURE) ×3 IMPLANT
SUT FIBERWIRE #2 38 T-5 BLUE (SUTURE)
SUT MNCRL AB 3-0 PS2 18 (SUTURE) ×3 IMPLANT
SUT VIC AB 0 SH 27 (SUTURE) ×2 IMPLANT
SUT VIC AB 2-0 SH 27 (SUTURE)
SUT VIC AB 2-0 SH 27XBRD (SUTURE) IMPLANT
SUT VICRYL 4-0 PS2 18IN ABS (SUTURE) IMPLANT
SUTURE FIBERWR #2 38 T-5 BLUE (SUTURE) IMPLANT
SYR BULB 3OZ (MISCELLANEOUS) ×3 IMPLANT
SYR CONTROL 10ML LL (SYRINGE) ×2 IMPLANT
TOWEL OR 17X24 6PK STRL BLUE (TOWEL DISPOSABLE) ×6 IMPLANT
TUBE CONNECTING 20'X1/4 (TUBING) ×1
TUBE CONNECTING 20X1/4 (TUBING) ×2 IMPLANT
UNDERPAD 30X30 INCONTINENT (UNDERPADS AND DIAPERS) ×3 IMPLANT

## 2014-10-14 NOTE — Anesthesia Preprocedure Evaluation (Signed)
Anesthesia Evaluation  Patient identified by MRN, date of birth, ID band Patient awake    Reviewed: Allergy & Precautions, NPO status , Patient's Chart, lab work & pertinent test results  Airway Mallampati: I   Neck ROM: full    Dental   Pulmonary neg pulmonary ROS,          Cardiovascular negative cardio ROS      Neuro/Psych    GI/Hepatic   Endo/Other  Morbid obesity  Renal/GU      Musculoskeletal   Abdominal   Peds  Hematology   Anesthesia Other Findings   Reproductive/Obstetrics                             Anesthesia Physical Anesthesia Plan  ASA: I  Anesthesia Plan: General and Regional   Post-op Pain Management: MAC Combined w/ Regional for Post-op pain   Induction: Intravenous  Airway Management Planned: LMA  Additional Equipment:   Intra-op Plan:   Post-operative Plan:   Informed Consent: I have reviewed the patients History and Physical, chart, labs and discussed the procedure including the risks, benefits and alternatives for the proposed anesthesia with the patient or authorized representative who has indicated his/her understanding and acceptance.     Plan Discussed with: CRNA, Anesthesiologist and Surgeon  Anesthesia Plan Comments:         Anesthesia Quick Evaluation

## 2014-10-14 NOTE — Discharge Instructions (Addendum)
Gina Arthurs, MD [facility]  Please read the following information regarding your care after surgery.  Medications  You only need a prescription for the narcotic pain medicine (ex. oxycodone, Percocet, Norco).  All of the other medicines listed below are available over the counter. X Percocet as prescribed for pain.  Switch to Motrin or Tylenol as soon as you can.  Narcotic pain medicine (ex. oxycodone, Percocet, Vicodin) will cause constipation.  To prevent this problem, take the following medicines while you are taking any pain medicine. X docusate sodium (Colace) 100 mg twice a day X senna (Senokot) 2 tablets twice a day  X To help prevent blood clots, take an aspirin (325 mg) once a day for a month after surgery.  You should also get up every hour while you are awake to move around.    Weight Bearing ? Bear weight when you are able on your operated leg or foot. ? Bear weight only on the heel of your operated foot in the post-op shoe. X Do not bear any weight on the operated leg or foot.  Cast / Splint / Dressing X Keep your splint or cast clean and dry.  Dont put anything (coat hanger, pencil, etc) down inside of it.  If it gets damp, use a hair dryer on the cool setting to dry it.  If it gets soaked, call the office to schedule an appointment for a cast change. ? Remove your dressing 3 days after surgery and cover the incisions with dry dressings.    After your dressing, cast or splint is removed; you may shower, but do not soak or scrub the wound.  Allow the water to run over it, and then gently pat it dry.  Swelling It is normal for you to have swelling where you had surgery.  To reduce swelling and pain, keep your toes above your nose for at least 3 days after surgery.  It may be necessary to keep your foot or leg elevated for several weeks.  If it hurts, it should be elevated.  Follow Up Call my office at [phone] when you are discharged from the hospital or  surgery center to schedule an appointment to be seen two weeks after surgery.  Call my office at [phone] if you develop a fever >101.5 F, nausea, vomiting, bleeding from the surgical site or severe pain.     Regional Anesthesia Blocks  1. Numbness or the inability to move the "blocked" extremity may last from 3-48 hours after placement. The length of time depends on the medication injected and your individual response to the medication. If the numbness is not going away after 48 hours, call your surgeon.  2. The extremity that is blocked will need to be protected until the numbness is gone and the  Strength has returned. Because you cannot feel it, you will need to take extra care to avoid injury. Because it may be weak, you may have difficulty moving it or using it. You may not know what position it is in without looking at it while the block is in effect.  3. For blocks in the legs and feet, returning to weight bearing and walking needs to be done carefully. You will need to wait until the numbness is entirely gone and the strength has returned. You should be able to move your leg and foot normally before you try and bear weight or walk. You will need someone to be with you when you first try to ensure  you do not fall and possibly risk injury.  4. Bruising and tenderness at the needle site are common side effects and will resolve in a few days.  5. Persistent numbness or new problems with movement should be communicated to the surgeon or the [facility] [phone])/ [facility] [phone]).    Post Anesthesia Home Care Instructions  Activity: Get plenty of rest for the remainder of the day. A responsible adult should stay with you for 24 hours following the procedure.  For the next 24 hours, DO NOT: -Drive a car -Advertising copywriterperate machinery -Drink alcoholic beverages -Take any medication unless instructed by your physician -Make any legal decisions or sign  important papers.  Meals: Start with liquid foods such as gelatin or soup. Progress to regular foods as tolerated. Avoid greasy, spicy, heavy foods. If nausea and/or vomiting occur, drink only clear liquids until the nausea and/or vomiting subsides. Call your physician if vomiting continues.  Special Instructions/Symptoms: Your throat may feel dry or sore from the anesthesia or the breathing tube placed in your throat during surgery. If this causes discomfort, gargle with warm salt water. The discomfort should disappear within 24 hours.

## 2014-10-14 NOTE — Anesthesia Postprocedure Evaluation (Signed)
Anesthesia Post Note  Patient: Gina Jones  Procedure(s) Performed: Procedure(s) (LRB): OPEN REDUCTION INTERNAL FIXATION (ORIF) RIGHT ANKLE FRACTURE AND SYNDESMOSIS (Right)  Anesthesia type: General  Patient location: PACU  Post pain: Pain level controlled and Adequate analgesia  Post assessment: Post-op Vital signs reviewed, Patient's Cardiovascular Status Stable, Respiratory Function Stable, Patent Airway and Pain level controlled  Last Vitals:  Filed Vitals:   10/14/14 1645  BP: 145/94  Pulse: 82  Temp:   Resp: 17    Post vital signs: Reviewed and stable  Level of consciousness: awake, alert  and oriented  Complications: No apparent anesthesia complications

## 2014-10-14 NOTE — Brief Op Note (Signed)
10/14/2014  4:15 PM  PATIENT:  Gina Jones  34 y.o. female  PRE-OPERATIVE DIAGNOSIS:  RIGHT ANKLE BIMALLEOLAR FRACTURE AND SYNDESMOSIS DISRUPTION  POST-OPERATIVE DIAGNOSIS:  RIGHT ANKLE BIMALLEOLAR FRACTURE AND SYNDESMOSIS DISRUPTION  Procedure(s): 1.  OPEN REDUCTION INTERNAL FIXATION (ORIF) RIGHT ANKLE bimal FRACTURE 2.   ORIF right ankle SYNDESMOSIS 3.  Stress exam of right ankle 4.  AP, lateral and mortise xrays of the right ankle  SURGEON:  Toni ArthursJohn Icesis Renn, MD  ASSISTANT: n/a  ANESTHESIA:   General, regional  EBL:  minimal   TOURNIQUET:  55 min @ 350 mmHg  COMPLICATIONS:  None apparent  DISPOSITION:  Extubated, awake and stable to recovery.  DICTATION ID:  [id number]

## 2014-10-14 NOTE — Transfer of Care (Signed)
Immediate Anesthesia Transfer of Care Note  Patient: Gina Jones  Procedure(s) Performed: Procedure(s): OPEN REDUCTION INTERNAL FIXATION (ORIF) RIGHT ANKLE FRACTURE AND SYNDESMOSIS (Right)  Patient Location: PACU  Anesthesia Type:General  Level of Consciousness: awake and alert   Airway & Oxygen Therapy: Patient Spontanous Breathing and Patient connected to face mask oxygen  Post-op Assessment: Report given to RN and Post -op Vital signs reviewed and stable  Post vital signs: Reviewed and stable  Last Vitals:  Filed Vitals:   10/14/14 1425  BP:   Pulse: 80  Temp:   Resp: 14    Complications: No apparent anesthesia complications

## 2014-10-14 NOTE — H&P (Signed)
Gina Jones is an 34 y.o. female.   Chief Complaint: right ankle pain HPI: 34 y/o female with PMH of obesity fell a week ago injuring her right ankle.  Xrays in the ED showed a displaced bimal fracture with syndesmosis disruption.  She presents now for ORIF.  Past Medical History  Diagnosis Date  . Bimalleolar ankle fracture 10/09/2014    right    Past Surgical History  Procedure Laterality Date  . No past surgeries      History reviewed. No pertinent family history. Social History:  reports that she has never smoked. She has never used smokeless tobacco. She reports that she drinks alcohol. She reports that she does not use illicit drugs.  Allergies: No Known Allergies  Medications Prior to Admission  Medication Sig Dispense Refill  . ibuprofen (ADVIL,MOTRIN) 200 MG tablet Take 200 mg by mouth every 6 (six) hours as needed.    . [DISCONTINUED] oxyCODONE-acetaminophen (PERCOCET/ROXICET) 5-325 MG per tablet Take 2 tablets by mouth every 4 (four) hours as needed for moderate pain or severe pain. 20 tablet 0    No results found for this or any previous visit (from the past 48 hour(s)). No results found.  ROS  No recent f/c/n/v/wt loss  Blood pressure 150/84, pulse 108, temperature 97.8 F (36.6 C), temperature source Oral, resp. rate 23, height 5\' 7"  (1.702 m), weight 103.42 kg (228 lb), last menstrual period 09/23/2014, SpO2 100 %. Physical Exam  Obese female in nad.  A and O x 4.  Mood and affect normal.  EOMI.  resp unlabored.  R ankle with healthy skin.  No lymphadenopathy.  No blisters but moderate swelling.  5/5 strength in PF and DF of the ankle and toes.  Sens to LT intact.  Brisk cap refill at the toes.  Assessment/Plan R ankle bimal fracture and syndesmosis disruption - to OR for ORIF.  The risks and benefits of the alternative treatment options have been discussed in detail.  The patient wishes to proceed with surgery and specifically understands risks of bleeding,  infection, nerve damage, blood clots, need for additional surgery, amputation and death.   Gina Jones, Gina Jones 10/14/2014, 4:40 PM

## 2014-10-14 NOTE — Anesthesia Procedure Notes (Addendum)
Anesthesia Regional Block:  Popliteal block  Pre-Anesthetic Checklist: ,, timeout performed, Correct Patient, Correct Site, Correct Laterality, Correct Procedure, Correct Position, site marked, Risks and benefits discussed,  Surgical consent,  Pre-op evaluation,  At surgeon's request and post-op pain management  Laterality: Right  Prep: chloraprep       Needles:  Injection technique: Single-shot  Needle Type: Echogenic Stimulator Needle     Needle Length: 9cm 9 cm Needle Gauge: 21 and 21 G    Additional Needles:  Procedures: ultrasound guided (picture in chart) and nerve stimulator Popliteal block  Nerve Stimulator or Paresthesia:  Response: plantar flexion of foot, 0.45 mA,   Additional Responses:   Narrative:  Start time: 10/14/2014 1:50 PM End time: 10/14/2014 2:06 PM Injection made incrementally with aspirations every 5 mL.  Performed by: Personally  Anesthesiologist: HODIERNE, ADAM  Additional Notes: Functioning IV was confirmed and monitors were applied.  A 90mm 21ga Arrow echogenic stimulator needle was used. Sterile prep and drape,hand hygiene and sterile gloves were used.  Negative aspiration and negative test dose prior to incremental administration of local anesthetic (20mL of 0.5% Bupivacaine + epi). The patient tolerated the procedure well.  Ultrasound guidance: relevent anatomy identified, needle position confirmed, local anesthetic spread visualized around nerve(s), vascular puncture avoided.  Image printed for medical record.   Adductor canal block done additionally. Sterile technique used.  Ultrasound used.  20 mL of 0.5% Bupivacaine + epi was injected.  No complications.   Procedure Name: LMA Insertion Date/Time: 10/14/2014 2:53 PM Performed by: Caren MacadamARTER, Malai Lady W Pre-anesthesia Checklist: Patient identified, Emergency Drugs available, Suction available and Patient being monitored Patient Re-evaluated:Patient Re-evaluated prior to inductionOxygen Delivery  Method: Circle System Utilized Preoxygenation: Pre-oxygenation with 100% oxygen Intubation Type: IV induction Ventilation: Mask ventilation without difficulty LMA: LMA inserted LMA Size: 4.0 Number of attempts: 1 Airway Equipment and Method: Bite block Placement Confirmation: positive ETCO2 and breath sounds checked- equal and bilateral Tube secured with: Tape Dental Injury: Teeth and Oropharynx as per pre-operative assessment

## 2014-10-14 NOTE — Progress Notes (Signed)
Assisted Dr. Hodierne with right, ultrasound guided, popliteal, adductor canal block. Side rails up, monitors on throughout procedure. See vital signs in flow sheet. Tolerated Procedure well. 

## 2014-10-15 NOTE — Op Note (Signed)
Gina Jones, Gina Jones                ACCOUNT NO.:  [id number]  MEDICAL RECORD NO.:  [id number]  LOCATION:                                 FACILITY:  PHYSICIAN:  Gina Arthurs, MD        DATE OF BIRTH:  [birth year]  DATE OF PROCEDURE:  10/14/2014 DATE OF DISCHARGE:  10/14/2014                              OPERATIVE REPORT   PREOPERATIVE DIAGNOSIS:  Right ankle bimalleolar fracture and syndesmosis disruption.  POSTOPERATIVE DIAGNOSIS:  Right ankle bimalleolar fracture and syndesmosis disruption.  PROCEDURE: 1. Open reduction and internal fixation of right ankle bimalleolar     fracture. 2. Stress examination of the right ankle. 3. Open reduction and internal fixation of right ankle syndesmosis. 4. AP, lateral, and mortise radiographs of the right ankle.  SURGEON:  Gina Arthurs, MD  ANESTHESIA:  General, regional.  ESTIMATED BLOOD LOSS:  Minimal.  TOURNIQUET TIME:  55 minutes at 350 mmHg.  COMPLICATIONS:  None apparent.  DISPOSITION:  Extubated, awake, and stable to recovery.  INDICATIONS FOR PROCEDURE:  The patient is a 34 year old woman, who fell a week ago injuring her right ankle.  Radiographs in the emergency room showed a bimalleolar fracture with widening of the syndesmosis.  She presents now for operative treatment of this painful injury.  She understands the risks and benefits, the alternative treatment options, and elects surgical treatment.  She specifically understands risks of bleeding, infection, nerve damage, blood clots, need for additional surgery, continued pain, amputation, and death.  PROCEDURE IN DETAIL:  After preoperative consent was obtained and the correct operative site was identified, the patient was brought to the operating room and placed supine on the operating table.  General anesthesia was induced.  Preoperative antibiotics were administered. Surgical time-out was taken.  The right lower extremity was prepped and draped in standard sterile  fashion with tourniquet around the thigh. The extremity was exsanguinated and tourniquet was inflated at 350 mmHg. A longitudinal incision was made over the lateral malleolus.  Sharp dissection was carried down through skin and subcutaneous tissue.  The fracture site was identified and was cleaned of all hematoma and irrigated.  It was reduced and held with a tenaculum.  A 3.5-mm fully- threaded lag screw was inserted from anterior to posterior across the fracture site.  It was noted to have excellent purchase.  An 8-hole one- third tubular plate was contoured to fit the lateral malleolus.  It was secured distally with 3 unicortical screws and proximally with 3 bicortical screws leaving the central 2 holes free.  Attention was then turned to the medial aspect of the ankle, where a longitudinal incision was made over the medial malleolus.  Sharp dissection was carried down through the skin and subcutaneous tissue. The fracture site was cleaned of all hematoma and irrigated.  Periosteum was cleared from the fracture site.  The fracture site was reduced and provisionally held with a tenaculum.  AP and lateral radiographs confirmed appropriate reduction of the fracture.  A K-wires were inserted across the fracture site.  AP and lateral radiographs confirmed appropriate position.  The medial malleolus fracture fragment was rather small, so single screw was inserted in the  center portion of the fracture.  The oblique nature of the fracture given inherent stability and it did not appear to rotate at all.  AP, mortise, and lateral radiographs confirmed appropriate reduction of both fractures in the appropriate position and length of all hardware. The mortise view was then obtained.  Dorsiflexion and external rotation stress was applied with the forefoot held in a supinated position. There was obvious widening of the syndesmosis.  The decision was made to proceed with syndesmotic fixation.  A  large King Tong clamp was placed at the medial and lateral malleolus. The ankle was maximally dorsiflexed and the syndesmosis was closed down and held with a tenaculum.  Two 3.5-mm fully-threaded screws were inserted across 4 cortices of the distal fibula and tibia.  AP and lateral radiographs confirmed appropriate reduction of the syndesmosis and appropriate screw position.  Both wounds were then irrigated copiously.  Final AP, mortise, and lateral radiographs were obtained. The incisions were closed with Vicryl, Monocryl, and nylon followed by a well-padded short-leg splint.  Tourniquet was released at 55 minutes after application of the dressings.  The patient was then awakened from anesthesia and transported to the recovery room in stable condition.  FOLLOWUP PLAN:  The patient will be nonweightbearing on the right lower extremity.  She will follow up with me in 2 weeks for suture removal and conversion to a short-leg cast.  X-RAYS:  Three-views of the right ankle are obtained intraoperatively today.  These show interval reduction and fixation of the syndesmosis medial and lateral malleolus fractures.  Hardware is appropriately positioned and of the appropriate length.  No other acute injury is noted.     Gina ArthursJohn Theora Vankirk, MD     JH/MEDQ  D:  10/14/2014  T:  10/15/2014  Job:  [id number]

## 2014-10-18 ENCOUNTER — Encounter (HOSPITAL_BASED_OUTPATIENT_CLINIC_OR_DEPARTMENT_OTHER): Payer: Self-pay | Admitting: Orthopedic Surgery

## 2014-10-18 LAB — POCT HEMOGLOBIN-HEMACUE: Hemoglobin: 13.6 g/dL (ref 12.0–15.0)

## 2015-06-06 ENCOUNTER — Emergency Department (HOSPITAL_COMMUNITY): Payer: BLUE CROSS/BLUE SHIELD

## 2015-06-06 ENCOUNTER — Encounter (HOSPITAL_COMMUNITY): Payer: Self-pay | Admitting: Emergency Medicine

## 2015-06-06 ENCOUNTER — Emergency Department (HOSPITAL_COMMUNITY)
Admission: EM | Admit: 2015-06-06 | Discharge: 2015-06-06 | Disposition: A | Discharge status: Home / Self Care | Payer: Self-pay | Attending: Emergency Medicine | Admitting: Emergency Medicine

## 2015-06-06 DIAGNOSIS — X58XXXA Exposure to other specified factors, initial encounter: Secondary | ICD-10-CM | POA: Insufficient documentation

## 2015-06-06 DIAGNOSIS — Y9389 Activity, other specified: Secondary | ICD-10-CM | POA: Insufficient documentation

## 2015-06-06 DIAGNOSIS — Y9289 Other specified places as the place of occurrence of the external cause: Secondary | ICD-10-CM | POA: Insufficient documentation

## 2015-06-06 DIAGNOSIS — Z7982 Long term (current) use of aspirin: Secondary | ICD-10-CM | POA: Insufficient documentation

## 2015-06-06 DIAGNOSIS — Z8781 Personal history of (healed) traumatic fracture: Secondary | ICD-10-CM | POA: Insufficient documentation

## 2015-06-06 DIAGNOSIS — S93401A Sprain of unspecified ligament of right ankle, initial encounter: Secondary | ICD-10-CM | POA: Insufficient documentation

## 2015-06-06 DIAGNOSIS — Y998 Other external cause status: Secondary | ICD-10-CM | POA: Insufficient documentation

## 2015-06-06 MED ORDER — HYDROCODONE-ACETAMINOPHEN 5-325 MG PO TABS: 2.0000 | ORAL_TABLET | Freq: Once | ORAL | Status: DC

## 2015-06-06 MED ORDER — HYDROCODONE-ACETAMINOPHEN 5-325 MG PO TABS: 2.0000 | ORAL_TABLET | ORAL | Status: AC | PRN

## 2015-06-06 MED ORDER — HYDROCODONE-ACETAMINOPHEN 5-325 MG PO TABS
2.0000 | ORAL_TABLET | Freq: Once | ORAL | Status: AC
  Administered 2015-06-06: 2 via ORAL
  Filled 2015-06-06: qty 2

## 2015-06-06 NOTE — ED Notes (Signed)
PA at bedside.

## 2015-06-06 NOTE — ED Provider Notes (Signed)
CSN: [id number]     Arrival date & time 06/06/15  1942 History  By signing my name below, I, Soijett Blue, attest that this documentation has been prepared under the direction and in the presence of Langston Masker, PA-C Electronically Signed: Soijett Blue, ED Scribe. 06/06/2015. 8:51 PM.   Chief Complaint  Patient presents with  . Foot Injury      The history is provided by the patient. No language interpreter was used.    Gina Jones is a 34 y.o. female with a medical hx of right bimalleolar ankle fracture who presents to the Emergency Department complaining of right foot injury onset today PTA. She reports that she was walking when she twisted her right foot in a ditch. She notes that since the incident she has been able to walk with no issue. She states that she had surgery to her right foot in February 2016 after she broke her foot and she had her hardware taken out in June 2016. She had her right foot surgery completed by Dr. Victorino Dike. Pt is having associated symptoms of joint swelling. She notes that she has not tried any medications for the relief of his symptoms. She denies color change, wound, rash, and any other symptoms.    Past Medical History  Diagnosis Date  . Bimalleolar ankle fracture 10/09/2014    right   Past Surgical History  Procedure Laterality Date  . No past surgeries    . Orif ankle fracture Right 10/14/2014    Procedure: OPEN REDUCTION INTERNAL FIXATION (ORIF) RIGHT ANKLE FRACTURE AND SYNDESMOSIS;  Surgeon: Toni Arthurs, MD;  Location: [facility];  Service: Orthopedics;  Laterality: Right;   No family history on file. Social History  Substance Use Topics  . Smoking status: Never Smoker   . Smokeless tobacco: Never Used  . Alcohol Use: Yes     Comment: occasionally   OB History    No data available     Review of Systems  Musculoskeletal: Positive for joint swelling and arthralgias. Negative for gait problem.  Skin: Negative for color change,  rash and wound.      Allergies  Review of patient's allergies indicates no known allergies.  Home Medications   Prior to Admission medications   Medication Sig Start Date End Date Taking? Authorizing Provider  aspirin EC 325 MG tablet Take 1 tablet (325 mg total) by mouth daily. 10/14/14   Toni Arthurs, MD  oxyCODONE-acetaminophen (PERCOCET/ROXICET) 5-325 MG per tablet Take 1-2 tablets by mouth every 4 (four) hours as needed for moderate pain or severe pain. 10/14/14   Toni Arthurs, MD   BP 116/76 mmHg  Pulse 82  Temp(Src) 98.5 F (36.9 C) (Oral)  Resp 18  Ht  (1.727 m)  Wt 232 lb (105.235 kg)  BMI 35.28 kg/m2  SpO2 99%  LMP 06/06/2015 Physical Exam  Constitutional: She is oriented to person, place, and time. She appears well-developed and well-nourished. No distress.  HENT:  Head: Normocephalic and atraumatic.  Eyes: EOM are normal.  Neck: Neck supple.  Cardiovascular: Normal rate.   Pulmonary/Chest: Effort normal. No respiratory distress.  Musculoskeletal: Normal range of motion.       Right ankle: She exhibits swelling.  Right ankle: Healed surgical incisions inner and outer ankle. Swelling with pitting.  Neurological: She is alert and oriented to person, place, and time.  Skin: Skin is warm and dry.  Psychiatric: She has a normal mood and affect. Her behavior is normal.  Nursing  note and vitals reviewed.   ED Course  Procedures (including critical care time) DIAGNOSTIC STUDIES: Oxygen Saturation is 99% on RA, nl by my interpretation.    COORDINATION OF CARE: 8:49 PM Discussed treatment plan with pt at bedside which includes right ankle xray and pt agreed to plan.    Labs Review Labs Reviewed - No data to display  Imaging Review Dg Ankle Complete Right  06/06/2015  CLINICAL DATA:  Right ankle pain after fall and twisting injury. History of fracture and surgical fixation. EXAM: RIGHT ANKLE - COMPLETE 3+ VIEW COMPARISON:  Preoperative radiographs  10/09/2014. No postoperative exams available. FINDINGS: Post ORIF of bimalleolar fracture with partially cannulated screw traversing the medial malleolus and lateral plate and multi screw fixation of the distal fibular fracture. No periprosthetic lucency or fracture. There are ghost tracks in the distal tibia likely related to prior hardware. Small calcifications adjacent to ghost -track in the region of the interosseous membrane appear chronic. No acute fractures seen. No dislocation. The alignment is maintained. Mild diffuse soft tissue edema. IMPRESSION: 1. Soft tissue edema without acute fracture or subluxation. 2. Intact surgical hardware post ORIF of bimalleolar fracture. Electronically Signed   By: Rubye OaksMelanie  Ehinger M.D.   On: 06/06/2015 21:44   I have personally reviewed and evaluated these images as part of my medical decision-making.   EKG Interpretation None      MDM   Final diagnoses:  Ankle sprain, right, initial encounter   Pt placed in cam walker, crutches,  Rx for Hydrocodone Pt advised to call Dr. Victorino DikeHewitt tomorrow to be seen and evaluated.     Lonia SkinnerLeslie K CarrolltonSofia, PA-C 06/06/15 2303  Rolan BuccoMelanie Belfi, MD 06/06/15 [phone]

## 2015-06-06 NOTE — Discharge Instructions (Signed)
Ankle Sprain  An ankle sprain is an injury to the strong, fibrous tissues (ligaments) that hold the bones of your ankle joint together.   CAUSES  An ankle sprain is usually caused by a fall or by twisting your ankle. Ankle sprains most commonly occur when you step on the outer edge of your foot, and your ankle turns inward. People who participate in sports are more prone to these types of injuries.   SYMPTOMS    Pain in your ankle. The pain may be present at rest or only when you are trying to stand or walk.   Swelling.   Bruising. Bruising may develop immediately or within 1 to 2 days after your injury.   Difficulty standing or walking, particularly when turning corners or changing directions.  DIAGNOSIS   Your caregiver will ask you details about your injury and perform a physical exam of your ankle to determine if you have an ankle sprain. During the physical exam, your caregiver will press on and apply pressure to specific areas of your foot and ankle. Your caregiver will try to move your ankle in certain ways. An X-ray exam may be done to be sure a bone was not broken or a ligament did not separate from one of the bones in your ankle (avulsion fracture).   TREATMENT   Certain types of braces can help stabilize your ankle. Your caregiver can make a recommendation for this. Your caregiver may recommend the use of medicine for pain. If your sprain is severe, your caregiver may refer you to a surgeon who helps to restore function to parts of your skeletal system (orthopedist) or a physical therapist.  HOME CARE INSTRUCTIONS    Apply ice to your injury for 1-2 days or as directed by your caregiver. Applying ice helps to reduce inflammation and pain.    Put ice in a plastic bag.    Place a towel between your skin and the bag.    Leave the ice on for 15-20 minutes at a time, every 2 hours while you are awake.   Only take over-the-counter or prescription medicines for pain, discomfort, or fever as directed by  your caregiver.   Elevate your injured ankle above the level of your heart as much as possible for 2-3 days.   If your caregiver recommends crutches, use them as instructed. Gradually put weight on the affected ankle. Continue to use crutches or a cane until you can walk without feeling pain in your ankle.   If you have a plaster splint, wear the splint as directed by your caregiver. Do not rest it on anything harder than a pillow for the first 24 hours. Do not put weight on it. Do not get it wet. You may take it off to take a shower or bath.   You may have been given an elastic bandage to wear around your ankle to provide support. If the elastic bandage is too tight (you have numbness or tingling in your foot or your foot becomes cold and blue), adjust the bandage to make it comfortable.   If you have an air splint, you may blow more air into it or let air out to make it more comfortable. You may take your splint off at night and before taking a shower or bath. Wiggle your toes in the splint several times per day to decrease swelling.  SEEK MEDICAL CARE IF:    You have rapidly increasing bruising or swelling.   Your toes feel   extremely cold or you lose feeling in your foot.   Your pain is not relieved with medicine.  SEEK IMMEDIATE MEDICAL CARE IF:   Your toes are numb or blue.   You have severe pain that is increasing.  MAKE SURE YOU:    Understand these instructions.   Will watch your condition.   Will get help right away if you are not doing well or get worse.     This information is not intended to replace advice given to you by your health care provider. Make sure you discuss any questions you have with your health care provider.     Document Released: 08/06/2005 Document Revised: 08/27/2014 Document Reviewed: 08/18/2011  Elsevier Interactive Patient Education 2016 Elsevier Inc.

## 2015-06-06 NOTE — ED Notes (Signed)
Patient transported to X-ray 

## 2015-06-06 NOTE — ED Notes (Signed)
Pt reports she twisted R ankle today in ditch. Pt with hx of fracture requiring surgery in the same ankle earlier this year. PMS intact.

## 2015-06-06 NOTE — Progress Notes (Signed)
Orthopedic Tech Progress Note Patient Details:  Gina Jones 04-Oct-1980 [id number] Applied CAM walker to RLE.  Fit pt. for crutches and taught use of same. Ortho Devices Type of Ortho Device: CAM walker, Crutches Ortho Device/Splint Location: RLE Ortho Device/Splint Interventions: Application   Lesle ChrisGilliland, Pieper Kasik L 06/06/2015, 10:32 PM

## 2015-08-19 ENCOUNTER — Encounter (HOSPITAL_COMMUNITY): Payer: Self-pay | Admitting: Emergency Medicine

## 2015-08-19 ENCOUNTER — Emergency Department (HOSPITAL_COMMUNITY)
Admission: EM | Admit: 2015-08-19 | Discharge: 2015-08-19 | Disposition: A | Discharge status: Home / Self Care | Payer: Self-pay | Attending: Emergency Medicine | Admitting: Emergency Medicine

## 2015-08-19 DIAGNOSIS — L02412 Cutaneous abscess of left axilla: Secondary | ICD-10-CM | POA: Insufficient documentation

## 2015-08-19 DIAGNOSIS — Z8781 Personal history of (healed) traumatic fracture: Secondary | ICD-10-CM | POA: Insufficient documentation

## 2015-08-19 MED ORDER — IBUPROFEN 600 MG PO TABS: 600.0000 mg | ORAL_TABLET | Freq: Four times a day (QID) | ORAL | Status: AC | PRN

## 2015-08-19 MED ORDER — SODIUM BICARBONATE 4 % IV SOLN
5.0000 mL | Freq: Once | INTRAVENOUS | Status: AC
  Administered 2015-08-19: 5 mL via INTRAVENOUS
  Filled 2015-08-19: qty 5

## 2015-08-19 MED ORDER — CEPHALEXIN 500 MG PO CAPS: 500.0000 mg | ORAL_CAPSULE | Freq: Four times a day (QID) | ORAL | Status: AC

## 2015-08-19 MED ORDER — LIDOCAINE HCL 2 % IJ SOLN
10.0000 mL | Freq: Once | INTRAMUSCULAR | Status: AC
  Administered 2015-08-19: 400 mg via INTRADERMAL
  Filled 2015-08-19: qty 20

## 2015-08-19 MED ORDER — OXYCODONE-ACETAMINOPHEN 5-325 MG PO TABS: 1.0000 | ORAL_TABLET | ORAL | Status: AC | PRN

## 2015-08-19 MED ORDER — OXYCODONE-ACETAMINOPHEN 5-325 MG PO TABS
1.0000 | ORAL_TABLET | Freq: Once | ORAL | Status: AC
  Administered 2015-08-19: 1 via ORAL
  Filled 2015-08-19: qty 1

## 2015-08-19 MED ORDER — SULFAMETHOXAZOLE-TRIMETHOPRIM 800-160 MG PO TABS: 1.0000 | ORAL_TABLET | Freq: Two times a day (BID) | ORAL | Status: AC

## 2015-08-19 NOTE — Discharge Instructions (Signed)
Take ibuprofen for pain. Oxycodone for severe pain. Take both antibiotics as prescribed until all gone. Warm compresses to the axilla several times a day. Follow-up in 2 days for recheck and packing removal.   Abscess An abscess is an infected area that contains a collection of pus and debris.It can occur in almost any part of the body. An abscess is also known as a furuncle or boil. CAUSES  An abscess occurs when tissue gets infected. This can occur from blockage of oil or sweat glands, infection of hair follicles, or a minor injury to the skin. As the body tries to fight the infection, pus collects in the area and creates pressure under the skin. This pressure causes pain. People with weakened immune systems have difficulty fighting infections and get certain abscesses more often.  SYMPTOMS Usually an abscess develops on the skin and becomes a painful mass that is red, warm, and tender. If the abscess forms under the skin, you may feel a moveable soft area under the skin. Some abscesses break open (rupture) on their own, but most will continue to get worse without care. The infection can spread deeper into the body and eventually into the bloodstream, causing you to feel ill.  DIAGNOSIS  Your caregiver will take your medical history and perform a physical exam. A sample of fluid may also be taken from the abscess to determine what is causing your infection. TREATMENT  Your caregiver may prescribe antibiotic medicines to fight the infection. However, taking antibiotics alone usually does not cure an abscess. Your caregiver may need to make a small cut (incision) in the abscess to drain the pus. In some cases, gauze is packed into the abscess to reduce pain and to continue draining the area. HOME CARE INSTRUCTIONS   Only take over-the-counter or prescription medicines for pain, discomfort, or fever as directed by your caregiver.  If you were prescribed antibiotics, take them as directed. Finish  them even if you start to feel better.  If gauze is used, follow your caregiver's directions for changing the gauze.  To avoid spreading the infection:  Keep your draining abscess covered with a bandage.  Wash your hands well.  Do not share personal care items, towels, or whirlpools with others.  Avoid skin contact with others.  Keep your skin and clothes clean around the abscess.  Keep all follow-up appointments as directed by your caregiver. SEEK MEDICAL CARE IF:   You have increased pain, swelling, redness, fluid drainage, or bleeding.  You have muscle aches, chills, or a general ill feeling.  You have a fever. MAKE SURE YOU:   Understand these instructions.  Will watch your condition.  Will get help right away if you are not doing well or get worse.   This information is not intended to replace advice given to you by your health care provider. Make sure you discuss any questions you have with your health care provider.   Document Released: 05/16/2005 Document Revised: 02/05/2012 Document Reviewed: 10/19/2011 Elsevier Interactive Patient Education [employer] Elsevier Inc.

## 2015-08-19 NOTE — ED Provider Notes (Signed)
CSN: [id number]     Arrival date & time 08/19/15  1711 History  By signing my name below, I, Gina Jones, attest that this documentation has been prepared under the direction and in the presence of Jerome Viglione, PA-C.  Electronically Signed: Tanda RockersMargaux Jones, ED Scribe. 08/19/2015. 7:27 PM.   Chief Complaint  Patient presents with  . Abscess   The history is provided by the patient. No language interpreter was used.     HPI Comments: Gina Jones is a 34 y.o. female who presents to the Emergency Department complaining of gradual onset, constant, painful, abscess to left axilla x 1 week. There is no drainage to the area. Pt has hx of abscesses in the past. Pt reports that she has had to have an abscess drained in the past and another abscess that gradually improved from antibiotics. Denies fever, chills, or any other associated symptoms.    Past Medical History  Diagnosis Date  . Bimalleolar ankle fracture 10/09/2014    right   Past Surgical History  Procedure Laterality Date  . No past surgeries    . Orif ankle fracture Right 10/14/2014    Procedure: OPEN REDUCTION INTERNAL FIXATION (ORIF) RIGHT ANKLE FRACTURE AND SYNDESMOSIS;  Surgeon: Toni ArthursJohn Hewitt, MD;  [facility];  Service: Orthopedics;  Laterality: Right;   History reviewed. No pertinent family history. Social History  Substance Use Topics  . Smoking status: Never Smoker   . Smokeless tobacco: Never Used  . Alcohol Use: Yes     Comment: occasionally   OB History    No data available     Review of Systems  Constitutional: Negative for fever and chills.  Skin:       + Abscess to left axilla Negative for drainage   Allergies  Review of patient's allergies indicates no known allergies.  Home Medications   Prior to Admission medications   Medication Sig Start Date End Date Taking? Authorizing Provider  HYDROcodone-acetaminophen (NORCO/VICODIN) 5-325 MG tablet Take 2 tablets by mouth  every 4 (four) hours as needed. 06/06/15  Yes Lonia SkinnerLeslie K Sofia, PA-C  ibuprofen (ADVIL,MOTRIN) 200 MG tablet Take 400 mg by mouth every 6 (six) hours as needed for moderate pain.   Yes Historical Provider, MD  aspirin EC 325 MG tablet Take 1 tablet (325 mg total) by mouth daily. Patient not taking: Reported on 08/19/2015 10/14/14   Toni ArthursJohn Hewitt, MD  oxyCODONE-acetaminophen (PERCOCET/ROXICET) 5-325 MG per tablet Take 1-2 tablets by mouth every 4 (four) hours as needed for moderate pain or severe pain. Patient not taking: Reported on 08/19/2015 10/14/14   Toni ArthursJohn Hewitt, MD   Triage Vitals: BP 155/105 mmHg  Pulse 72  Temp(Src) 98.4 F (36.9 C) (Oral)  Resp 16  SpO2 100%   Physical Exam  Constitutional: She is oriented to person, place, and time. She appears well-developed and well-nourished. No distress.  HENT:  Head: Normocephalic and atraumatic.  Eyes: Conjunctivae and EOM are normal.  Neck: Neck supple. No tracheal deviation present.  Cardiovascular: Normal rate.   Pulmonary/Chest: Effort normal. No respiratory distress.  Musculoskeletal: Normal range of motion.  Neurological: She is alert and oriented to person, place, and time.  Skin: Skin is warm and dry.  5 x 4 cm abscess to the left axilla. The area is indurated, tender. No drainage. No surrounding cellulitis  Psychiatric: She has a normal mood and affect. Her behavior is normal.  Nursing note and vitals reviewed.   ED Course  Procedures (including  critical care time)  INCISION AND DRAINAGE PROCEDURE NOTE: Patient identification was confirmed and verbal consent was obtained. This procedure was performed by Jaynie Crumble, PA-C at 6:50 PM. Site: Left azilla Sterile procedures observed Needle size: 25 Anesthetic used (type and amt): 6 CCs 2% Lidocaine and 4& Sodium Bicarbonate Blade size: 11 Drainage: Copious Complexity: Complex Packing used: Iodoform  Site anesthetized, incision made over site, wound drained and  explored loculations, rinsed with copious amounts of normal saline, wound packed with sterile gauze, covered with dry, sterile dressing.  Pt tolerated procedure well without complications.  Instructions for care discussed verbally and pt provided with additional written instructions for homecare and f/u.   DIAGNOSTIC STUDIES: Oxygen Saturation is 100% on RA, normal by my interpretation.    COORDINATION OF CARE: 6:24 PM-Discussed treatment plan which includes I&D with pt at bedside and pt agreed to plan.   Labs Review Labs Reviewed - No data to display  Imaging Review No results found.   EKG Interpretation None      MDM   Final diagnoses:  Abscess of left axilla    Patient with left axillary abscess. Incised and drained with copious copious amount of purulent drainage. Packed. Will start on keflex and bactrim. Percocet for pain. Follow up with PCP or here for recheck and packing removal in 2 days. Patient is otherwise nontoxic appearing. Afebrile. No surrounding cellulitis. No evidence of systemic infection.  Filed Vitals:   08/19/15 1806  BP: 155/105  Pulse: 72  Temp: 98.4 F (36.9 C)  TempSrc: Oral  Resp: 16  SpO2: 100%    I personally performed the services described in this documentation, which was scribed in my presence. The recorded information has been reviewed and is accurate.   Jaynie Crumble, PA-C 08/19/15 1945  Richardean Canal, MD 08/19/15 [phone]

## 2015-08-19 NOTE — ED Notes (Signed)
Pt c/o abscess to left armpit x 5 days. Hx of same.

## 2016-03-05 ENCOUNTER — Ambulatory Visit (HOSPITAL_COMMUNITY)
Admission: EM | Admit: 2016-03-05 | Discharge: 2016-03-05 | Disposition: A | Discharge status: Home / Self Care | Payer: Self-pay | Attending: Family Medicine | Admitting: Family Medicine

## 2016-03-05 ENCOUNTER — Encounter (HOSPITAL_COMMUNITY): Payer: Self-pay | Admitting: Emergency Medicine

## 2016-03-05 DIAGNOSIS — L02412 Cutaneous abscess of left axilla: Secondary | ICD-10-CM

## 2016-03-05 MED ORDER — DOXYCYCLINE HYCLATE 100 MG PO CAPS: 100.0000 mg | ORAL_CAPSULE | Freq: Two times a day (BID) | ORAL | Status: AC

## 2016-03-05 NOTE — ED Notes (Signed)
Assisted dr Artis Flockkindl with i/d of abscess.

## 2016-03-05 NOTE — ED Provider Notes (Signed)
CSN: [id number]     Arrival date & time 03/05/16  1021 History   First MD Initiated Contact with Patient 03/05/16 1206     Chief Complaint  Patient presents with  . Abscess   (Consider location/radiation/quality/duration/timing/severity/associated sxs/prior Treatment) Patient is a 35 y.o. female presenting with abscess. The history is provided by the patient.  Abscess Location:  Shoulder/arm Shoulder/arm abscess location:  L axilla Abscess quality: fluctuance, induration and painful   Red streaking: no   Duration:  1 week Progression:  Worsening Chronicity:  New Ineffective treatments:  Warm compresses Associated symptoms: no fever   Risk factors: prior abscess     Past Medical History  Diagnosis Date  . Bimalleolar ankle fracture 10/09/2014    right   Past Surgical History  Procedure Laterality Date  . No past surgeries    . Orif ankle fracture Right 10/14/2014    Procedure: OPEN REDUCTION INTERNAL FIXATION (ORIF) RIGHT ANKLE FRACTURE AND SYNDESMOSIS;  Surgeon: Toni ArthursJohn Hewitt, MD;  Location: [facility];  Service: Orthopedics;  Laterality: Right;   Family History  Problem Relation Age of Onset  . Healthy Mother    Social History  Substance Use Topics  . Smoking status: Never Smoker   . Smokeless tobacco: Never Used  . Alcohol Use: Yes     Comment: occasionally   OB History    No data available     Review of Systems  Constitutional: Negative.  Negative for fever.  Skin: Positive for rash.  All other systems reviewed and are negative.   Allergies  Review of patient's allergies indicates no known allergies.  Home Medications   Prior to Admission medications   Medication Sig Start Date End Date Taking? Authorizing Provider  guaiFENesin (MUCINEX) 600 MG 12 hr tablet Take by mouth 2 (two) times daily.   Yes Historical Provider, MD  aspirin EC 325 MG tablet Take 1 tablet (325 mg total) by mouth daily. Patient not taking: Reported on 08/19/2015 10/14/14    Toni ArthursJohn Hewitt, MD  cephALEXin (KEFLEX) 500 MG capsule Take 1 capsule (500 mg total) by mouth 4 (four) times daily. Patient not taking: Reported on 03/05/2016 08/19/15   Tatyana Kirichenko, PA-C  doxycycline (VIBRAMYCIN) 100 MG capsule Take 1 capsule (100 mg total) by mouth 2 (two) times daily. 03/05/16   Linna HoffJames D Abagayle Klutts, MD  HYDROcodone-acetaminophen (NORCO/VICODIN) 5-325 MG tablet Take 2 tablets by mouth every 4 (four) hours as needed. Patient not taking: Reported on 03/05/2016 06/06/15   Elson AreasLeslie K Sofia, PA-C  ibuprofen (ADVIL,MOTRIN) 600 MG tablet Take 1 tablet (600 mg total) by mouth every 6 (six) hours as needed. 08/19/15   Tatyana Kirichenko, PA-C  oxyCODONE-acetaminophen (PERCOCET) 5-325 MG tablet Take 1 tablet by mouth every 4 (four) hours as needed for severe pain. Patient not taking: Reported on 03/05/2016 08/19/15   Jaynie Crumbleatyana Kirichenko, PA-C   Meds Ordered and Administered this Visit  Medications - No data to display  BP 137/94 mmHg  Pulse 62  Temp(Src) 97.9 F (36.6 C) (Oral)  Resp 16  Ht 5\' 6"  (1.676 m)  Wt 230 lb (104.327 kg)  BMI 37.14 kg/m2  SpO2 100%  LMP 03/04/2016 No data found.   Physical Exam  Constitutional: She is oriented to person, place, and time. She appears well-developed and well-nourished. She appears distressed.  Neurological: She is alert and oriented to person, place, and time.  Skin: Skin is warm and dry. There is erythema.  Nursing note and vitals reviewed.   ED  Course  .Marland KitchenIncision and Drainage Date/Time: 03/05/2016 12:37 PM Performed by: Linna Hoff Authorized by: Bradd Canary D Consent: Verbal consent obtained. Type: abscess Body area: trunk Location details: chest Local anesthetic: topical anesthetic Patient sedated: no Scalpel size: 11 Incision type: single straight Incision depth: dermal Complexity: simple Drainage: purulent Drainage amount: copious Wound treatment: wound left open Patient tolerance: Patient tolerated the procedure  well with no immediate complications   (including critical care time)  Labs Review Labs Reviewed - No data to display  Imaging Review No results found.   Visual Acuity Review  Right Eye Distance:   Left Eye Distance:   Bilateral Distance:    Right Eye Near:   Left Eye Near:    Bilateral Near:         MDM   1. Abscess of left axilla        Linna Hoff, MD 03/05/16 1243

## 2016-03-05 NOTE — ED Notes (Signed)
Abscess to left axilla.  Noticed one week ago.  Used warm compress.

## 2017-03-07 IMAGING — DX DG ANKLE COMPLETE 3+V*R*
3 series · 3 of 3 positions shown · non-contrast
Comparison: Preoperative radiographs 10/09/2014. No postoperative
exams available.

CLINICAL DATA: Right ankle pain after fall and twisting injury.
History of fracture and surgical fixation.

EXAM:
RIGHT ANKLE - COMPLETE 3+ VIEW

[ankle ap]
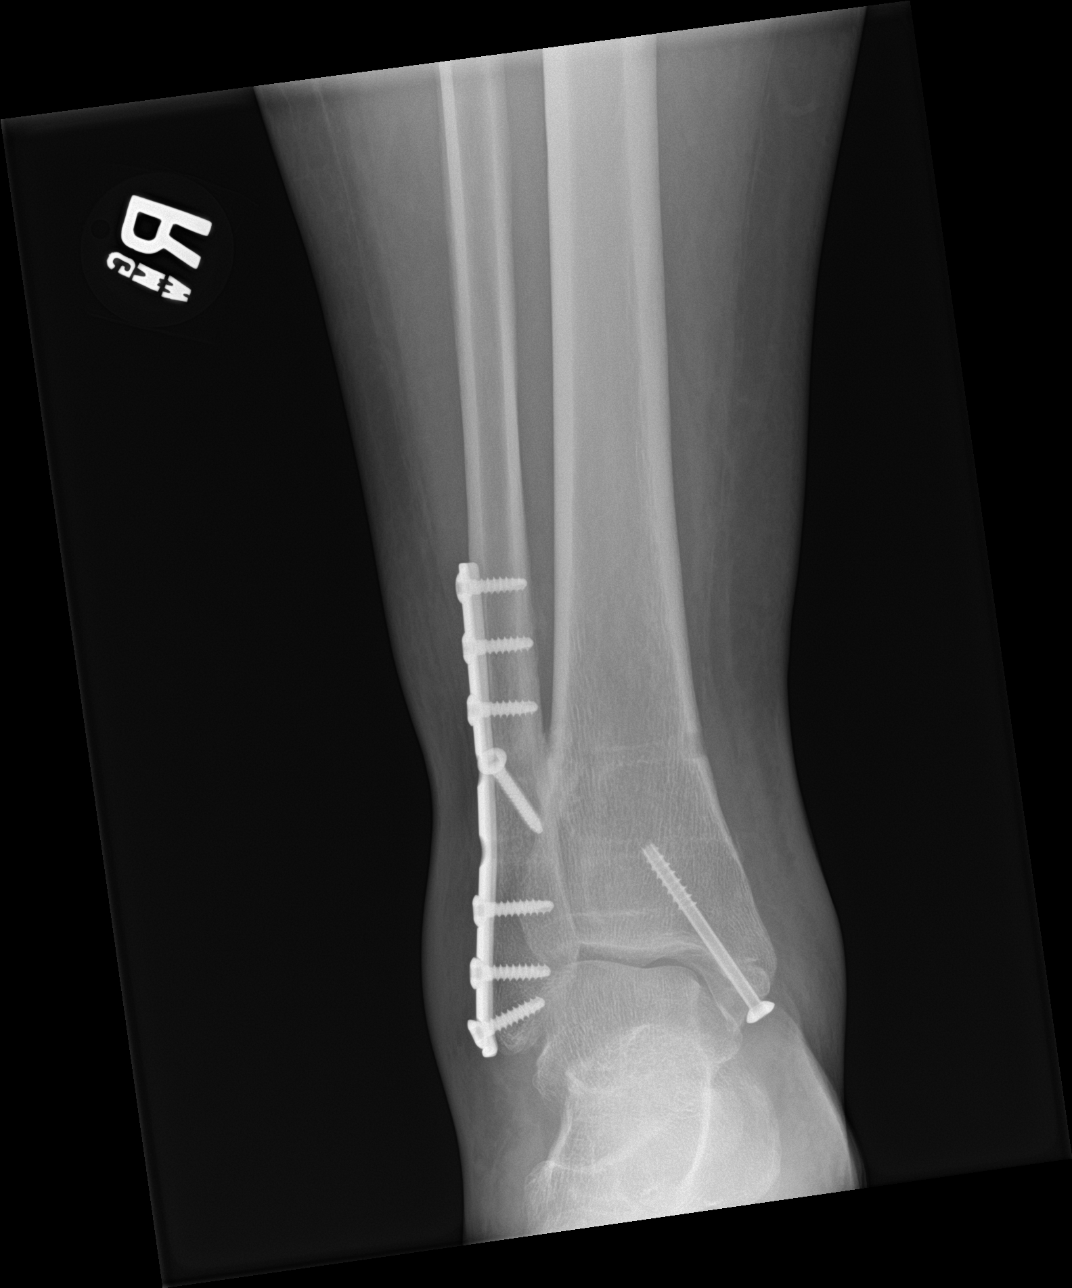

[ankle obl]
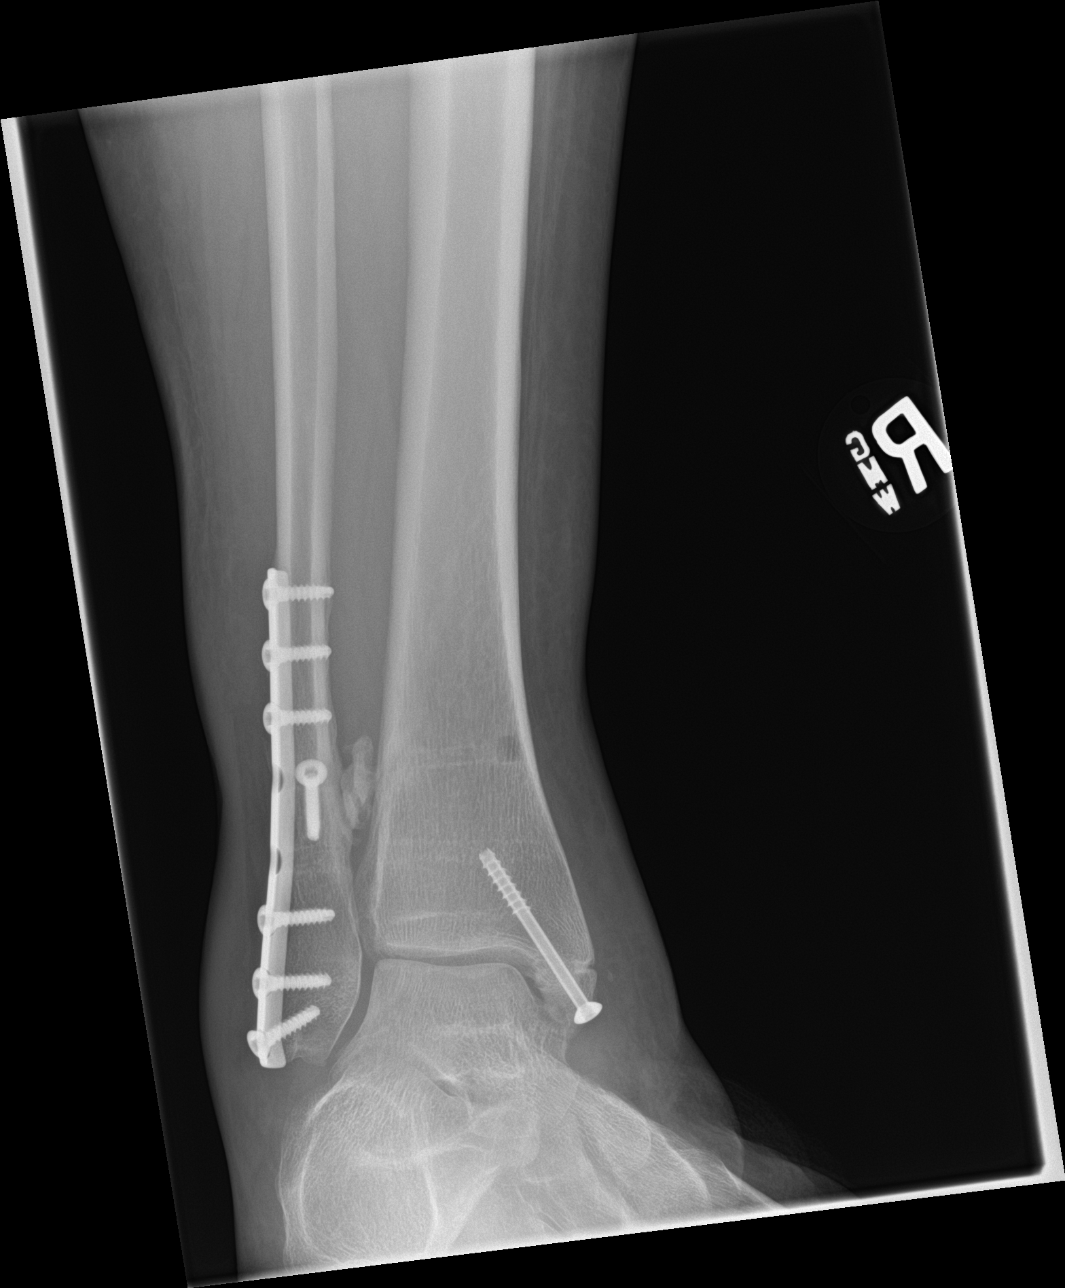

[ankle lat]
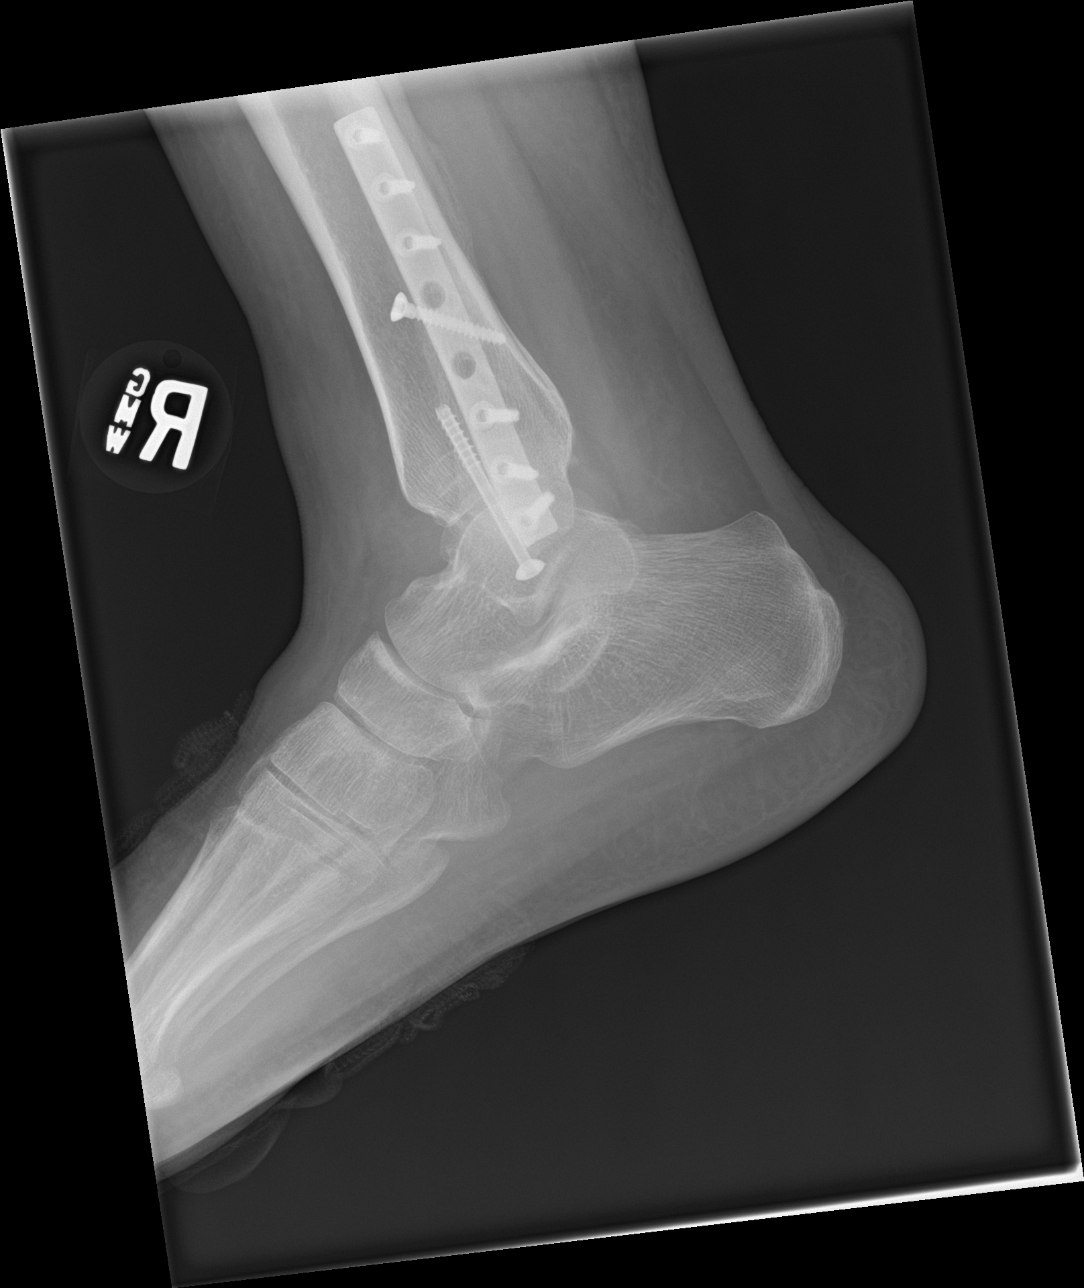

[3 of 3 positions shown; findings below may reference images not displayed]

FINDINGS: Post ORIF of bimalleolar fracture with partially cannulated screw
traversing the medial malleolus and lateral plate and multi screw
fixation of the distal fibular fracture. No periprosthetic lucency
or fracture. There are ghost tracks in the distal tibia likely
related to prior hardware. Small calcifications adjacent to ghost
-track in the region of the interosseous membrane appear chronic. No
acute fractures seen. No dislocation. The alignment is maintained.
Mild diffuse soft tissue edema.
IMPRESSION: 1. Soft tissue edema without acute fracture or subluxation.
2. Intact surgical hardware post ORIF of bimalleolar fracture.

## 2019-10-01 ENCOUNTER — Ambulatory Visit: Payer: Self-pay | Attending: Internal Medicine

## 2019-10-01 DIAGNOSIS — Z20822 Contact with and (suspected) exposure to covid-19: Secondary | ICD-10-CM | POA: Insufficient documentation

## 2019-10-02 LAB — NOVEL CORONAVIRUS, NAA: SARS-CoV-2, NAA: NOT DETECTED

## 2020-03-09 ENCOUNTER — Ambulatory Visit: Payer: HRSA Program | Attending: Internal Medicine

## 2020-03-09 DIAGNOSIS — Z20822 Contact with and (suspected) exposure to covid-19: Secondary | ICD-10-CM | POA: Insufficient documentation

## 2020-03-10 LAB — SPECIMEN STATUS REPORT

## 2020-03-11 LAB — SARS-COV-2, NAA 2 DAY TAT

## 2020-03-11 LAB — SPECIMEN STATUS REPORT

## 2020-03-11 LAB — NOVEL CORONAVIRUS, NAA: SARS-CoV-2, NAA: NOT DETECTED
